# Patient Record
Sex: Male | Born: 1960 | Race: White | Hispanic: No | Marital: Married | Smoking: Never smoker
Health system: Southern US, Community
[De-identification: ages and names within clinical notes are randomized; demographics above are authoritative.]

## PROBLEM LIST (undated history)

## (undated) HISTORY — PX: KNEE SURGERY: SHX244

## (undated) HISTORY — PX: HIP SURGERY: SHX245

---

## 2003-10-06 ENCOUNTER — Emergency Department: Admit: 2003-10-06 | Payer: Self-pay | Source: Emergency Department | Admitting: Internal Medicine

## 2003-10-09 ENCOUNTER — Emergency Department: Admit: 2003-10-09 | Payer: Self-pay | Source: Emergency Department

## 2007-12-24 ENCOUNTER — Emergency Department
Admission: EM | Admit: 2007-12-24 | Disposition: A | Discharge status: Home / Self Care | Payer: Self-pay | Source: Emergency Department | Admitting: Emergency Medicine

## 2015-05-09 ENCOUNTER — Encounter (INDEPENDENT_AMBULATORY_CARE_PROVIDER_SITE_OTHER): Payer: Self-pay | Admitting: Family

## 2015-05-09 ENCOUNTER — Ambulatory Visit (INDEPENDENT_AMBULATORY_CARE_PROVIDER_SITE_OTHER): Payer: PRIVATE HEALTH INSURANCE | Admitting: Family

## 2015-05-09 VITALS — BP 130/79 | HR 55 | Temp 98.3°F | Resp 16 | Ht 70.0 in | Wt 198.0 lb

## 2015-05-09 DIAGNOSIS — R05 Cough: Secondary | ICD-10-CM

## 2015-05-09 DIAGNOSIS — R059 Cough, unspecified: Secondary | ICD-10-CM

## 2015-05-09 DIAGNOSIS — J069 Acute upper respiratory infection, unspecified: Secondary | ICD-10-CM

## 2015-05-09 LAB — POCT INFLUENZA A/B
POCT Rapid Influenza A AG: NEGATIVE
POCT Rapid Influenza B AG: NEGATIVE

## 2015-05-09 NOTE — Patient Instructions (Addendum)
OTC flonase, mucinex or sudafed during the day. Then cough suppressant OTC like robitussin or delsym. Cool mist humidifer. Start claritin or zyrtec once a day, please be patient, the medication can take up to 3-5 days to actually start to work then upwards of 30 days to build in the system.  Ten deep breaths every hour.  HYDRATE HYDRATE HYDRATE.        Adult Self-Care for Colds  Colds are caused by viruses. They can't be cured with antibiotics. However, you can relieve symptoms and support your body's efforts to heal itself. No matter which symptoms you have, be sure to drink plenty of fluids (water or clear soup); stop smoking and drinking alcohol; and get plenty of rest.   Understand a fever   Take your temperature several times a day. If your fever is100.4F(38.0C) for more than a day, call your doctor.   Relax, lie down. Go to bed if you want. Just get off your feet and rest. Also, drink plenty of fluids to avoid dehydration.   Take acetaminophen or a nonsteroidal anti-inflammatory agent (NSAID), such as ibuprofen.  Treat a troubled nose kindly   Breathe steam or heated humidified air to open blocked nasal passages. Stand in a hot shower or use a vaporizer. Be careful not to get burned by the steam.   Saline nasal sprays and decongestant tablets help open a stuffy nose. Antihistamines can also help, but they can cause side effects such as drowsiness and drying of the eyes, nose, and mouth.  Soothe a sore throat and cough   Gargle every2hours with1/4teaspoon of salt dissolved in1/2 cup of warm water. Suck on throat lozenges and cough drops to moisten your throat.   Cough medicines are available but it is unclear how effective they actually are.   Take acetaminophen or an NSAID, such as ibuprofen to ease throat pain  Ease digestive problems   Put fluid back into your body. Take frequent sips of clear liquids such as water or broth. Do not drink beverages with a lot of sugar in them, such as juices  and sodas. These can make diarrhea worse. Older children and adults can drink sports drinks.   As your appetite returns, you can resume your normal diet. Ask your doctor whether there are any foods you should avoid.    When to seek medical care  When you first notice symptoms, ask your health care provider if antiviral medications are appropriate.Antibiotics should not be taken for colds or flu. Also, call your doctor if you have any of the following symptoms or if you aren't feeling better after 7days:   Shortness of breath   Pain or pressure in the chest or abdomen   Worsening symptoms, especially after a period of improvement   Fever of100.4F (38.0C) or higher, or fever that doesn't go down with medication   Sudden dizziness or confusion   Severe or continued vomiting   Signs of dehydration, including extreme thirst, dark urine, infrequent urination, dry mouth   Spotted, red, or very sore throat   Date Last Reviewed: 08/17/2012   2000-2016 [employer] [address], [place] [address]. All rights reserved. This information is not intended as a substitute for professional medical care. Always follow your healthcare professional's instructions.

## 2015-05-09 NOTE — Progress Notes (Signed)
PRIMARY CARE WALK-IN    PROGRESS NOTE      Patient: Clinton Stephens   Date: 05/09/2015   MRN: [id number]     No past medical history on file.  Social History     Social History   . Marital Status: Married     Spouse Name: N/A   . Number of Children: N/A   . Years of Education: N/A     Occupational History   . Not on file.     Social History Main Topics   . Smoking status: Never Smoker    . Smokeless tobacco: Not on file   . Alcohol Use: No   . Drug Use: No   . Sexual Activity: Not on file     Other Topics Concern   . Not on file     Social History Narrative   . No narrative on file     No family history on file.    ASSESSMENT/PLAN     TRUSTIN CHAPA is a 55 y.o. male    Chief Complaint   Patient presents with   . Cough        1. Acute URI     2. Cough  POCT Influenza A/B       DDX: URI viral vs bacterial, seasonal allergies, acid reflux, PND, dry throat, PNA, bronchitis.       Results for orders placed or performed in visit on 05/09/15   POCT Influenza A/B   Result Value Ref Range    POCT QC Pass     POCT Rapid Influenza A AG Negative Negative    POCT Rapid Influenza B AG Negative Negative       Patient Instructions     OTC flonase, mucinex or sudafed during the day. Then cough suppressant OTC like robitussin or delsym. Cool mist humidifer. Start claritin or zyrtec once a day, please be patient, the medication can take up to 3-5 days to actually start to work then upwards of 30 days to build in the system.    Ten deep breaths every hour.  HYDRATE HYDRATE HYDRATE.          Adult Self-Care for Colds  Colds are caused by viruses. They can't be cured with antibiotics. However, you can relieve symptoms and support your body's efforts to heal itself. No matter which symptoms you have, be sure to drink plenty of fluids (water or clear soup); stop smoking and drinking alcohol; and get plenty of rest.   Understand a fever   Take your temperature several times a day. If your fever is100.47F(38.0C) for more than a day,  call your doctor.   Relax, lie down. Go to bed if you want. Just get off your feet and rest. Also, drink plenty of fluids to avoid dehydration.   Take acetaminophen or a nonsteroidal anti-inflammatory agent (NSAID), such as ibuprofen.  Treat a troubled nose kindly   Breathe steam or heated humidified air to open blocked nasal passages. Stand in a hot shower or use a vaporizer. Be careful not to get burned by the steam.   Saline nasal sprays and decongestant tablets help open a stuffy nose. Antihistamines can also help, but they can cause side effects such as drowsiness and drying of the eyes, nose, and mouth.  Soothe a sore throat and cough   Gargle every2hours with1/4teaspoon of salt dissolved in1/2 cup of warm water. Suck on throat lozenges and cough drops to moisten your throat.   Cough  medicines are available but it is unclear how effective they actually are.   Take acetaminophen or an NSAID, such as ibuprofen to ease throat pain  Ease digestive problems   Put fluid back into your body. Take frequent sips of clear liquids such as water or broth. Do not drink beverages with a lot of sugar in them, such as juices and sodas. These can make diarrhea worse. Older children and adults can drink sports drinks.   As your appetite returns, you can resume your normal diet. Ask your doctor whether there are any foods you should avoid.    When to seek medical care  When you first notice symptoms, ask your health care provider if antiviral medications are appropriate.Antibiotics should not be taken for colds or flu. Also, call your doctor if you have any of the following symptoms or if you aren't feeling better after 7days:   Shortness of breath   Pain or pressure in the chest or abdomen   Worsening symptoms, especially after a period of improvement   Fever of100.24F (38.0C) or higher, or fever that doesn't go down with medication   Sudden dizziness or confusion   Severe or continued  vomiting   Signs of dehydration, including extreme thirst, dark urine, infrequent urination, dry mouth   Spotted, red, or very sore throat   Date Last Reviewed: 08/17/2012   2000-2016 [employer] [address] [place], [place] [address]. All rights reserved. This information is not intended as a substitute for professional medical care. Always follow your healthcare professional's instructions.                Risk & Benefits of the new medication(s) were explained to the patient (and family) who verbalized understanding & agreed to the treatment plan. Patient (family) encouraged to contact me/clinical staff with any questions/concerns      MEDICATIONS     No current outpatient prescriptions on file.     No current facility-administered medications for this visit.       No Known Allergies    SUBJECTIVE     Chief Complaint   Patient presents with   . Cough        Cough  This is a new problem. Episode onset: 2 days ago. The problem has been gradually worsening. The problem occurs constantly. The cough is non-productive. Associated symptoms include a fever (100.5), myalgias, postnasal drip and rhinorrhea. Pertinent negatives include no chest pain, chills, ear pain, headaches, nasal congestion, rash, sore throat, shortness of breath or wheezing. Nothing aggravates the symptoms. Treatments tried: dayquil and nyquil. The treatment provided moderate relief. There is no history of environmental allergies.       ROS     Review of Systems   Constitutional: Positive for fever (100.5). Negative for chills, activity change, appetite change and fatigue.   HENT: Positive for congestion, postnasal drip, rhinorrhea and sneezing. Negative for ear discharge, ear pain, sinus pressure, sore throat and trouble swallowing.    Respiratory: Positive for cough. Negative for chest tightness, shortness of breath and wheezing.    Cardiovascular: Negative for chest pain and palpitations.   Gastrointestinal: Negative for nausea,  vomiting and abdominal pain.   Musculoskeletal: Positive for myalgias.   Skin: Negative for rash.   Allergic/Immunologic: Negative for environmental allergies.   Neurological: Negative for headaches.       The following portions of the patient's history were reviewed and updated as appropriate: Allergies, Current Medications, Past Family History, Past Medical  history, Past social history, Past surgical history, and Problem List.    PHYSICAL EXAM     Filed Vitals:    05/09/15 0918   BP: 130/79   Pulse: 55   Temp: 98.3 F (36.8 C)   TempSrc: Oral   Resp: 16   Height: 1.778 m (5\' 10" )   Weight: 89.812 kg (198 lb)   SpO2: 96%       Physical Exam   Constitutional: He is oriented to person, place, and time. He appears well-developed and well-nourished. No distress.   HENT:   Head: Normocephalic and atraumatic.   Right Ear: Tympanic membrane, external ear and ear canal normal.   Left Ear: Tympanic membrane, external ear and ear canal normal.   Nose: Nose normal. No mucosal edema or rhinorrhea. Right sinus exhibits no maxillary sinus tenderness and no frontal sinus tenderness. Left sinus exhibits no maxillary sinus tenderness and no frontal sinus tenderness.   Mouth/Throat: Oropharynx is clear and moist. No oropharyngeal exudate, posterior oropharyngeal edema or posterior oropharyngeal erythema.   Neck: Normal range of motion. Neck supple.   Cardiovascular: Normal rate, regular rhythm and normal heart sounds.    Pulmonary/Chest: Effort normal and breath sounds normal. He has no wheezes.   Lymphadenopathy:     He has no cervical adenopathy.   Neurological: He is alert and oriented to person, place, and time.   Skin: Skin is warm and dry. No rash noted.   Psychiatric: He has a normal mood and affect. His behavior is normal. Judgment and thought content normal.     Ortho Exam  Neurologic Exam     Mental Status   Oriented to person, place, and time.       PROCEDURE(S)     Procedures        Signed,  Earlyne Feeser Lescault  FNP-C  Supervising physician: Dr. Zorita Pang, MD was not present during this visit.

## 2019-03-05 ENCOUNTER — Ambulatory Visit (INDEPENDENT_AMBULATORY_CARE_PROVIDER_SITE_OTHER): Payer: Self-pay | Admitting: Cardiovascular Disease

## 2019-03-05 ENCOUNTER — Other Ambulatory Visit (INDEPENDENT_AMBULATORY_CARE_PROVIDER_SITE_OTHER): Payer: Self-pay

## 2019-03-28 ENCOUNTER — Ambulatory Visit (INDEPENDENT_AMBULATORY_CARE_PROVIDER_SITE_OTHER): Payer: Self-pay

## 2019-04-04 ENCOUNTER — Ambulatory Visit (INDEPENDENT_AMBULATORY_CARE_PROVIDER_SITE_OTHER): Payer: Self-pay | Admitting: Cardiovascular Disease

## 2019-04-09 ENCOUNTER — Ambulatory Visit (INDEPENDENT_AMBULATORY_CARE_PROVIDER_SITE_OTHER): Payer: Self-pay

## 2019-04-18 ENCOUNTER — Ambulatory Visit (INDEPENDENT_AMBULATORY_CARE_PROVIDER_SITE_OTHER): Payer: Self-pay | Admitting: Cardiovascular Disease

## 2019-04-18 ENCOUNTER — Other Ambulatory Visit (INDEPENDENT_AMBULATORY_CARE_PROVIDER_SITE_OTHER): Payer: Self-pay

## 2019-05-04 ENCOUNTER — Other Ambulatory Visit: Payer: Self-pay | Admitting: Cardiovascular Disease

## 2019-06-06 ENCOUNTER — Ambulatory Visit (INDEPENDENT_AMBULATORY_CARE_PROVIDER_SITE_OTHER): Payer: Self-pay | Admitting: Cardiovascular Disease

## 2020-06-04 NOTE — H&P (Signed)
[facility] OFFICE  [address] [place], [place] [address]           Clinton Stephens    Date of Visit:  03/05/2019  Date of Birth: [birth year]  Age: 60 yrs.   Medical Record Number: [id number]  __  CURRENT DIAGNOSES     1. Shortness of breath, R06.02   2. Chest pain, unspecified, R07.9  3. Encounter for screening for other viral diseases, Z11.59  __  ALLERGIES     No Known Drug Allergies  __  MEDICATIONS     1. carbidopa 25 mg-levodopa 100 mg tablet, 1 po  tid  2. coenzyme Q10 200 mg capsule, 600mg  po qd  3. entacapone 200 mg tablet, 1 po bid  4. multivitamin tablet, 1 po qd  5. rasagiline mesylate (bulk) 100 % powder, 1mg  po qd  6. Super B-50 Complex capsule, Take as Directed  __   CHIEF COMPLAINT/REASON FOR VISIT  [place] discomfort, shortness of breath.  __  HISTORY OF PRESENT ILLNESS     Mr. Ledwell presents today for initial cardiovascular assessment. 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 He has no symptoms are  rest or at lower levels of exertion. And his symptoms at higher exercise levels are also only intermittent. His ECG and cardiovascular physical exam are both unremarkable in the office today.        __   PAST HISTORY     Past Medical Illnesses: Parkinson's Disease;   Past Cardiac Illnesses: No previous history of cardiac disease.; Infectious Diseases : history of Chicken Pox; Surgical Procedures: No previous surgical procedures.; Trauma History : No previous history of significant trauma.; Cardiology Procedures-Invasive: No previous interventional or invasive cardiology procedures.;  Cardiology Procedures-Noninvasive: No previous non-invasive cardiovascular testing.  ___  FAMILY  HISTORY  Father -- Parkinsonism, Senile dementia  Mother --  Cancer (Jaw Cancer), Macular degeneration  Sister -- Hypertension    __  CARDIAC RISK FACTORS      Tobacco Abuse: has never used  tobacco; Family History of Heart Disease: no family history of cardiovascular  disease; Hyperlipidemia: negative; Hypertension : negative;  Diabetes Mellitus: negative; Prior History of Heart Disease : negative; Obesity: negative; Sedentary Life Style :negative; JWJ:[id number]; Menopausal:not applicable   __  SOCIAL HISTORY    Alcohol Use : 1-2 drinks daily; Smoking: Does not smoke; Never smoker ([id number]); Diet : Regular diet; Exercise: cycling 2 hrs, 2-3x/week;   __  REVIEW OF SYSTEMS     General: Recent decreased exercise tolerance, Recent weight gain; Integumentary: Denies any change  in hair or nails, rashes, or skin lesions.; Eyes: wears eye glasses/contact lenses; Ears, Nose, Throat,  Mouth: Denies any hearing loss, epistaxis, hoarseness or difficulty speaking.;Respiratory: Denies dyspnea,  cough, wheezing or hemoptysis.; Cardiovascular: Positive for chest discomfort; Abdominal  : Denies ulcer disease, hematochezia or melena.;Musculoskeletal:Denies any venous insufficiency, arthritic symptoms or back problems.;  Neurological : Denies any recurrent strokes, TIA, or seizure disorder.; Psychiatric: Denies any depression,  substance abuse or change in cognitive functions.; Endocrine: Denies any weight change, heat/cold intolerance, polydipsia, or polyuria;  Hematologic/Immunologic: Denies any food allergies, seasonal allergies, bleeding disorders.  __  PHYSICAL EXAMINATION     Vital Signs:  Blood Pressure:  148/100 Supine, Right arm, regular cuff  130/90 Sitting, Left arm, regular cuff  140/100 Sitting, Right  arm, regular cuff  Weight: 194.20 lbs.  Height:  71"  BMI: 27.08   Pulse: 58/min.        Constitutional: Cooperative, alert and oriented,well developed, well nourished, in no acute distress. Skin:  Warm and dry to touch, no apparent skin lesions, or masses noted. Head: Normocephalic, normal hair pattern, no masses or tenderness  Eyes: EOMS Intact, PERRL, conjunctivae and lids normal. Funduscopic exam and  visual fields not performed. ENT : Ears, Nose and throat reveal no gross abnormalities. No pallor or cyanosis. Dentition good. Neck: No palpable masses or adenopathy, no thyromegaly,  no JVD, carotid pulses are full and equal bilaterally without bruits. Chest: Normal symmetry, no tenderness to palpation, normal respiratory excursion,  no intercostal retraction, no use of accessory muscles, normal diaphragmatic excursion, clear to auscultation and percussion. Cardiac: Regular rhythm,  S1 normal, S2 normal, No S3 or S4, Apical impulse not displaced, no murmurs, gallops or rubs detected. Abdomen: Abdomen soft, bowel sounds normoactive,  no masses, no hepatosplenomegaly, non-tender, no bruits Peripheral Pulses: The femoral, popliteal, dorsalis pedis, and posterior tibial pulses are full  and equal bilaterally with no bruits auscultated. Extremities/Back: No deformities, clubbing, cyanosis, erythema or edema observed. There are no spinal  abnormalities noted. Normal muscle strength and tone. Neurological: No gross motor or sensory deficits noted, affect appropriate, oriented to time, person  and place.   __    IMPRESSIONS:    1. Chest discomfort/shortness of breath (see HPI above for details).  2. Parkinsons.  3. Never smoker.    RECOMMENDATIONS:    1. Exercise treadmill stress test.  2. Echocardiogram.   3. Lipid panel.  4. Home BP checks: bring journal to follow-up.  5. Follow-up after above tests complete (and maintain lower levels of exercise [in asymptomtatic zone] in the interim).      Gwyneth Sprout. Idolina Primer, MD      cc:  ERIC S. HAVENS DO    ____________________________  TODAYS ORDERS  2D, color flow, doppler echocardiogram 1 week  Diet_mgmt_edu,_guidance_and_counseling TODAY  Lipid Panel 1 week  COVID-19-SARS-CoV-2 Antigen/RT-PCR Test 1 week   12 Lead ECG Today  Return Visit 15 MIN 3 weeks  Exercise Treadmill Bruce 1 week

## 2020-06-04 NOTE — Progress Notes (Signed)
[facility] OFFICE  [address] [place], [place] [address]           Clinton Stephens    Date of Visit:  04/18/2019  Date of Birth: 05-28-1960  Age: 60 yrs.   Medical Record Number: [id number]  __  CURRENT DIAGNOSES     1. Hyperlipidemia, mixed, E78.2   2. Hypertension (essential or benign or malignant), I10  3. Palpitations, R00.2  4. Shortness of breath, R06.02  5. Chest pain, unspecified, R07.9  6. Abnormal test-abnormal exercise stress test, R94.30  __   ALLERGIES    No Known Drug Allergies  __  MEDICATIONS      1. carbidopa 25 mg-levodopa 100 mg tablet, 1 po tid  2. coenzyme Q10 200 mg capsule, 600mg  po qd  3. entacapone 200 mg tablet, 1 po bid  4. multivitamin tablet, 1 po qd  5. rasagiline mesylate (bulk) 100 % powder, 1mg  po qd   6. Super B-50 Complex capsule, Take as Directed  __  CHIEF COMPLAINT/REASON FOR VISIT  Followup of Chest discomfort, shortness of breath.   __  HISTORY OF PRESENT ILLNESS    Clinton Stephens returns today for cardiovascular follow-up. He continues to note intermittent exertional chest discomfort.  He continues to exercise. He had no ECG evidence of ischemia on recent treadmill stress testing. His Echocardigoram was normal. He does also note palpitations and did have intermittent PVCs during stress testing. He had a hypertensive response to stress  testing but reports home BPs in the "130s" (he did not bring his journal today for review).      __  PAST HISTORY      Past Medical Illnesses: Parkinson's Disease;  Past Cardiac Illnesses : No previous history of cardiac disease.; Infectious Diseases: history of Chicken Pox; Surgical Procedures : No previous surgical procedures.; Trauma History: No previous history of significant trauma.; Cardiology  Procedures-Invasive: No previous interventional or invasive cardiology procedures.; Cardiology Procedures-Noninvasive : No previous non-invasive cardiovascular testing.  ___  FAMILY HISTORY  Father --  Parkinsonism, Senile  dementia  Mother -- Cancer (Jaw Cancer), Macular degeneration  Sister  -- Hypertension    __  CARDIAC RISK FACTORS     Tobacco Abuse: has never used tobacco;  Family History of Heart Disease: no family history of cardiovascular disease; Hyperlipidemia: negative;  Hypertension: negative;  Diabetes Mellitus: negative;  Prior History of Heart Disease: negative; Obesity: negative;  Sedentary Life Style:negative; JWJ:XBJYNWGN; Menopausal :not applicable  __  SOCIAL HISTORY     Alcohol Use: 1-2 drinks daily; Smoking: Does not smoke; Never smoker ([id number]);  Diet: Regular diet; Exercise: cycling 2 hrs, 2-3x/week;   __   REVIEW OF SYSTEMS    General: Recent decreased exercise tolerance, Recent weight gain;  Integumentary: Denies any change in hair or nails, rashes, or skin lesions.; Eyes: wears eye glasses/contact  lenses; Ears, Nose, Throat, Mouth: Denies any hearing loss, epistaxis, hoarseness or difficulty speaking.; Respiratory: Denies dyspnea, cough, wheezing or hemoptysis.; Cardiovascular: Positive for chest discomfort;  Abdominal : Denies ulcer disease, hematochezia or melena.;Musculoskeletal:Denies any venous insufficiency,  arthritic symptoms or back problems.; Neurological : Denies any recurrent strokes, TIA, or seizure disorder.;  Psychiatric: Denies any depression, substance abuse or change in cognitive functions.; Endocrine: Denies  any weight change, heat/cold intolerance, polydipsia, or polyuria; Hematologic/Immunologic: Denies any food allergies, seasonal allergies, bleeding disorders.   __  PHYSICAL EXAMINATION    Vital Signs:  Blood Pressure:   134/80 Sitting, Right arm, regular cuff  128/80  Sitting, Left arm, regular cuff    Weight: 192.00 lbs.   Height: 71.00"  BMI: 26.78   Pulse:  62/min. apical       Constitutional: Cooperative, alert and oriented,well developed, well nourished, in no acute distress.  Skin: Warm and dry to touch, no apparent skin lesions, or masses noted. Head: Normocephalic, normal   hair pattern, no masses or tenderness Eyes: EOMS Intact, PERRL, conjunctivae and lids normal. Funduscopic exam and visual fields not performed.  ENT: Ears, Nose and throat reveal no gross abnormalities. No pallor or cyanosis. Dentition good. Neck : No palpable masses or adenopathy, no thyromegaly, no JVD, carotid pulses are full and equal bilaterally without bruits. Chest: Normal symmetry, no  tenderness to palpation, normal respiratory excursion, no intercostal retraction, no use of accessory muscles, normal diaphragmatic excursion, clear to auscultation and percussion. Cardiac : Regular rhythm, S1 normal, S2 normal, No S3 or S4, Apical impulse not displaced, no murmurs, gallops or rubs detected. Abdomen: Abdomen soft, bowel  sounds normoactive, no masses, no hepatosplenomegaly, non-tender, no bruits Peripheral Pulses: The femoral, popliteal, dorsalis pedis, and posterior  tibial pulses are full and equal bilaterally with no bruits auscultated. Extremities/Back: No deformities, clubbing, cyanosis, erythema or edema observed.  There are no spinal abnormalities noted. Normal muscle strength and tone. Neurological: No gross motor or sensory deficits noted, affect appropriate,  oriented to time, person and place.   __    IMPRESSIONS:    1. Intermittent exertional chest discomfort and shortness of breath.  2. Exercise Stress Test 04-2019: no ECG evidence of ischemia, hypertensive response to exercise, recurrent  symptoms, intermittent PVCs.  3. Echo 04-2019: normal LV systolic function, no significant valvular disease.  4. Lipids 03-2019: LDL 109, HDL 70, TG 50.  5. Home BPs: "130s" (did not bring journal to this appointment).  6. Parkinsons.   7. Never smoker.  8. Palpitations.    RECOMMENDATIONS:    1. CT Coronary Angiogram: for definitive anatomic assessment in setting of recurrent exertional chest discomfort.  2. Defer decision-making re: Statin dependent on above results.   3. Holter monitor in setting of PVCs on  stress testing and intermittent palpitations.  4. Follow-up after above tests complete.  5. Continue home BP checks: and will review again at next visit.      Gwyneth Sprout. Idolina Primer, MD       cc: ERIC S. HAVENS DO    ____________________________  TODAYS ORDERS  Holter Monitor (48 Hours) Today  Telehealth Visit 6 weeks

## 2020-06-04 NOTE — Progress Notes (Signed)
[facility] OFFICE  [address] [place], [place] [address]     TELEMEDICINE VISIT       Clinton Stephens, Clinton Stephens    Date of Visit:  06/06/2019  Date of Birth: February 05, 1961  Age: 60 yrs.   Medical Record Number: [id number]  __  CURRENT DIAGNOSES     1. Hyperlipidemia, mixed, E78.2   2. Hypertension (essential or benign or malignant), I10  3. Palpitations, R00.2  4. Shortness of breath, R06.02  5. Chest pain, unspecified, R07.9  6. Abnormal test-abnormal exercise stress test, R94.30  __   ALLERGIES    No Known Drug Allergies  __  MEDICATIONS      1. aspirin 81 mg tablet,delayed release, 1 po qd  2. carbidopa 25 mg-levodopa 100 mg tablet, 1 po tid  3. coenzyme Q10 200 mg capsule, 600mg  po qd  4. entacapone 200 mg tablet, 1 po bid  5. multivitamin tablet, 1 po qd   6. rasagiline mesylate (bulk) 100 % powder, 1mg  po qd  7. Super B-50 Complex capsule, Take as Directed  __  CHIEF COMPLAINT/REASON FOR VISIT   Followup of Cardiovascular risk assessment.  __  HISTORY OF PRESENT ILLNESS    The visit today was conducted via telemedicine (Doximity) due  to COVID-19 precautions. The patient was in their home and was informed and gave verbal consent to proceed.    Clinton Stephens returns today for cardiovascular follow-up. His symptoms have all resolved. His multiple cardiovascular tests (see below for  details) were all unremarkable. He continues to exercise regularly (and has actually increased). He also just started incorporating new dietary changes. His home BP checks were borderline: with values in the 130s-140s (and he would like to defer consideration  of therapy for now pending BP rechecks in a few months after incorporating additional lifestyle modifications).      __  PAST HISTORY      Past Medical Illnesses: Parkinson's Disease;  Past Cardiac Illnesses : No previous history of cardiac disease.; Infectious Diseases: history of Chicken Pox; Surgical Procedures : No previous surgical procedures.; Trauma History: No previous  history of significant trauma.; Cardiology  Procedures-Invasive: No previous interventional or invasive cardiology procedures.; Cardiology Procedures-Noninvasive : CTA 05/04/19  ___  FAMILY HISTORY  Father --  Parkinsonism, Senile dementia  Mother -- Cancer (Jaw Cancer), Macular degeneration  Sister  -- Hypertension    __  CARDIAC RISK FACTORS     Tobacco Abuse: has never used tobacco;  Family History of Heart Disease: no family history of cardiovascular disease; Hyperlipidemia: negative;  Hypertension: negative;  Diabetes Mellitus: negative;  Prior History of Heart Disease: negative; Obesity: negative;  Sedentary Life Style:negative; MWU:[id number]; Menopausal :not applicable  __  SOCIAL HISTORY     Alcohol Use: 1-2 drinks daily; Smoking: Does not smoke; Never smoker ([id number]);  Diet: Regular diet; Exercise: cycling 2 hrs, 2-3x/week;   __   REVIEW OF SYSTEMS    General: Recent decreased exercise tolerance, Recent weight gain;  Integumentary: Denies any change in hair or nails, rashes, or skin lesions.; Eyes: wears eye glasses/contact  lenses; Ears, Nose, Throat, Mouth: Denies any hearing loss, epistaxis, hoarseness or difficulty speaking.; Respiratory: Denies dyspnea, cough, wheezing or hemoptysis.; Cardiovascular: Positive for chest discomfort;  Abdominal : Denies ulcer disease, hematochezia or melena.;Musculoskeletal:Denies any venous insufficiency,  arthritic symptoms or back problems.; Neurological : Denies any recurrent strokes, TIA, or seizure disorder.;  Psychiatric: Denies any depression, substance abuse or change in cognitive functions.; Endocrine: Denies  any  weight change, heat/cold intolerance, polydipsia, or polyuria; Hematologic/Immunologic: Denies any food allergies, seasonal allergies, bleeding disorders.   __  PHYSICAL EXAMINATION    Vital Signs:  Blood Pressure:       Weight: 194.00 lbs.  Height: 71.00"  BMI:  27.05       Constitutional: Cooperative, alert and oriented,well developed, well  nourished, in no acute distress.   Neck: no JVD Chest: Normal symmetry, normal respiratory  excursion, no intercostal retraction, no use of accessory muscles, normal diaphragmatic excursion Extremities/Back: no edema  Neurological: affect appropriate, oriented to time, person and place.   __    IMPRESSIONS:    1. Intermittent exertional chest discomfort and shortness of breath: now resolved.  2.  Exercise Stress Test 04-2019: Bruce protocol, 14 minutes exercise, no ECG evidence of ischemia, hypertensive response to exercise, recurrent symptoms, intermittent PVCs.  3. CTA coronaries 04-2019: calcium score = 0, minimal LAD and RCA plaque.   4. Echo 04-2019: normal LV systolic function, no significant valvular disease.  5. Lipids 03-2019: LDL 109, HDL 70, TG 50.  6. Home BPs: 130s-140s.  7. Parkinsons.  8. Never smoker.  9. Palpitations: resolved. Holter 04-2019: sinus rhythm,  average HR 59, no arrhythmia.    RECOMMENDATIONS:    1. Continue regule exercise (which he has begun to increase).  2. Continue recent dietary changes.  3. Reasess home BPs in 3 months and call with results: to giude consideration  of antihypertensive therapy or not.  4. Continue regular primary care follow-up.        Gwyneth Sprout. Idolina Primer, MD      cc: ERIC S. HAVENS DO

## 2020-07-11 ENCOUNTER — Encounter (INDEPENDENT_AMBULATORY_CARE_PROVIDER_SITE_OTHER): Payer: Self-pay

## 2020-07-11 ENCOUNTER — Ambulatory Visit (INDEPENDENT_AMBULATORY_CARE_PROVIDER_SITE_OTHER): Payer: PRIVATE HEALTH INSURANCE | Admitting: Internal Medicine

## 2020-07-11 VITALS — BP 128/76 | HR 75 | Temp 98.2°F | Resp 16 | Ht 71.0 in | Wt 196.0 lb

## 2020-07-11 DIAGNOSIS — U071 COVID-19: Secondary | ICD-10-CM

## 2020-07-11 DIAGNOSIS — R059 Cough, unspecified: Secondary | ICD-10-CM

## 2020-07-11 LAB — IHS AMB POCT SOFIA (TM) COVID-19 & FLU A/B
Sofia Influenza A Ag POCT: NEGATIVE
Sofia Influenza B Ag POCT: NEGATIVE
Sofia SARS COV2 Antigen POCT: POSITIVE — AB

## 2020-07-11 MED ORDER — NIRMATRELVIR & RITONAVIR 20 X 150 MG & 10 X 100MG PO TBPK
3.0000 | ORAL_TABLET | Freq: Two times a day (BID) | ORAL | 0 refills | Status: AC
Start: 2020-07-11 — End: 2020-07-16

## 2020-07-11 NOTE — Patient Instructions (Addendum)
What to do if you have confirmed or suspected coronavirus disease (COVID-19)?    If you are sick with COVID-19 or think you might have the virus that causes COVID-19, follow the steps below to help prevent the disease from spreading to people in your home and your community.    The most common signs of COVID-19 are: fever, cough, and difficulty breathing. If you think you have been exposed to COVID-19 and are having these symptoms, consider calling your doctor or other healthcare provider for medical advice. Your doctor will decide if you need to be tested or seen in person. Keep in mind that there is no treatment for COVID-19. People who are mildly ill may not need to be tested and should isolate (keep away from other people) and care for themselves at home.    Take these steps to monitor your health while you stay home and practice social distancing:    Stay home, except to get medical care, get rest and drink plenty of fluids.  Even if you are only mildly ill with COVID-19, stay home while you are sick.   Get rest and drink plenty of fluids.  Do not go to work, school, or public areas.  Avoid using public transportation such as buses, trains, taxis or ride-shares.  If someone in your household has tested positive for COVID-19, the entire household should stay home    Separate yourself from other people and animals in your home.  As much as possible, stay in a specific room away from others in your home. Use a separate bathroom, if available.  Avoid sharing personal household items such as dishes, drinking glasses, cups, utensils, towels, or bedding with other people or pets in your home. After using these items, they should be washed thoroughly with soap and water.  Restrict contact with pets and other animals while sick. Avoid petting, snuggling, being kissed or licked, and sharing food with your pet. When possible, have another member of your household care for your animals while you are sick. If you must care  for your pet or be around animals while you are sick, wash your hands before and after you interact with pets and wear a facemask.    Call ahead before visiting a doctor and monitor your symptoms.  Seek prompt medical attention if your illness is getting worse (e.g., difficulty breathing)  If you have a medical appointment or are seeking care, call the doctor's office and tell them you have, or may have COVID-19. Put on a facemask before you enter the building to help keep other people in the office or waiting room from sick.  If you need emergency medical care, call 911 and notify the dispatch personnel that you have, or may have COVID-19.   Emergency warning signs for COVID-19 may include: difficult breathing or shortness of breath, pain or pressure in the chest that doesn't go away, new confusion or inability to arouse, or bluish lips or face.    Wear a facemask when around other people or pets.  If you are sick, you should wear a facemask when you are around other people (e.g., sharing a room or vehicle) or pets and before you enter a doctor's office.  If the person who is sick is unable to wear a facemask (for example, because it causes trouble breathing) then other household members should wear a facemask if they enter a room with the person who is sick.    Cover your coughs and sneezes and   clean your hands often. Avoid sharing personal items.  Cover your mouth and nose with a tissue when you cough or sneeze and throw used tissues in a lined trashcan. Wash hands right after.  Wash hands often with soap and water for at least 20 seconds, especially after blowing your nose, coughing, sneezing; going to the bathroom; and before eating or preparing food.  If soap and water are not readily available, an alcohol-based hand sanitizer with at least 60% alcohol may be used. Cover all surfaces of your hands and rub them together until they feel dry.  Avoid touching your eyes, nose, and mouth with unwashed  hands.    Clean all frequently touched surfaces daily.  Clean and disinfect all surfaces that get touched often, such as counters, tabletops, doorknobs, bathroom fixtures, toilets, phones, keyboards, tablets, and bedside tables.  Disinfect areas with bodily fluids such as blood, stool, or body fluids on them.  Use a household cleaning spray or wipe, according to the label instructions.  For more information on household cleaning and disinfection: PokerWatcher.com.ee.html     Discontinuation home isolation.  If you are sick and have confirmed or suspected COVID-19 and have been directed to isolate at home, you can stop home isolation under the following conditions:  You have had no fever for at least 72 hours (that is three full days of no fever without the use of medicine that reduces fevers) AND other symptoms have improved (for example, when your cough or shortness of breath have improved) AND at least 7 days have passed since your symptoms first appeared.    For more information:  Visit [place] Department of Health's [facility]) COVID-19 website at: PrimeLetters.ca  Visit CDC's COVID-19 website "What To Do if You Are Sick" at: https://www.carlson.net/.html  Call [facility] COVID-19 hotline at 877-ASK-VDH3  (materials dated May 17, 2018)Take all prescriptions as directed.  Tylenol and or ibuprofen as directed on the bottles.  Nasal saline several times a day.  Nasal steroid.  Mucinex DM.  Push fluids with electrolytes.  To the Emergency Room or call 911 for any chest pain, difficulty breathing, heart racing, any concern.  Follow up with your doctor in 2-3 days.

## 2020-07-11 NOTE — Progress Notes (Signed)
[facility]  PROGRESS NOTE     Patient: Clinton Stephens   Date: 07/11/2020   MRN: [id number]       Clinton Stephens is a 60 y.o. male      HISTORY     History obtained from: Patient    Chief Complaint   Patient presents with   . Nasal Congestion     Nasal congestion, head pressure, and body aches began on Wednesday. Pt is covid vaccinated with booster.         60 yo M with Hx of Parkinson's, on meds.  C/O acute onset on 5-11 of NC, H/A, myalgia.  No Fever, N/V/D, SOB or CP.  Feels like he can't get a full breath in.  Hx of childhood asthma not on meds since then.  + COVID boosted and Flu vaccinated.            Review of Systems   All other systems reviewed and are negative.  As above.    History:    Pertinent Past Medical, Surgical, Family and Social History were reviewed.        Current Outpatient Medications:   .  carbidopa-levodopa (SINEMET) 25-100 MG per tablet, Take by mouth, Disp: , Rfl:   .  amantadine (SYMMETREL) 100 MG tablet, , Disp: , Rfl:   .  nirmatrelvir & ritonavir (PAXLOVID) 150 mg & 100 mg therapy pack (emergency use authorization), Take 3 tablets by mouth 2 (two) times daily for 5 days The dosage for PAXLOVID is 300 mg nirmatrelvir (two 150 mg tablets) with 100 mg ritonavir (one 100 mg tablet) with all three tablets taken together., Disp: 30 tablet, Rfl: 0  .  Rasagiline Mesylate 1 MG Tab, , Disp: , Rfl:     No Known Allergies    Medications and Allergies reviewed.    PHYSICAL EXAM     Vitals:    07/11/20 0828   BP: 128/76   Pulse: 75   Resp: 16   Temp: 98.2 F (36.8 C)   TempSrc: Tympanic   SpO2: 100%   Weight: 88.9 kg (196 lb)   Height: 1.803 m (5\' 11" )       Physical Exam  HENT:      Nose: Congestion present. No rhinorrhea.      Mouth/Throat:      Mouth: Mucous membranes are moist.      Pharynx: Oropharynx is clear. No oropharyngeal exudate or posterior oropharyngeal erythema.   Pulmonary:      Effort: Pulmonary effort is normal. No respiratory distress.      Breath sounds: Normal breath  sounds. No stridor. No wheezing, rhonchi or rales.   Chest:      Chest wall: No tenderness.   Vitals and nursing note reviewed.         UCC COURSE     LABS  The following POCT tests were ordered, reviewed and discussed with the patient/family.     Results     Procedure Component Value Units Date/Time    Sofia(TM) SARS COVID19 & Flu A/B POCT [[id number]]  (Abnormal) Collected: 07/11/20 0855     Updated: 07/11/20 0855     Sofia SARS COV2 Antigen POCT Positive     Sofia Influenza A Ag POCT Negative     Sofia Influenza B Ag POCT Negative          There were no x-rays reviewed with this patient during the visit.    No current facility-administered medications for  this visit.       PROCEDURES     Procedures    MEDICAL DECISION MAKING     History, physical, labs/studies most consistent with COVID 19 infection as the diagnosis.    Chart Review:  Prior PCP, Specialist and/or ED notes reviewed today: No  Prior labs/images/studies reviewed today: No    Differential Diagnosis: COVID 19; Flu; Viral syndrome      ASSESSMENT     Encounter Diagnoses   Name Primary?   . Cough Yes   . COVID-19 virus infection             PLAN      PLAN: COVID precautions; Self quarantine; Paxlovid; Tylenol / ibuprofen; Push fluids; Nasal saline; Nasal steroid; Mucinex DM; F/U with PCP in 2-3 days.     Body mass index is 27.34 kg/m.      Orders Placed This Encounter   Procedures   . Sofia(TM) SARS COVID19 & Flu A/B POCT     Requested Prescriptions     Signed Prescriptions Disp Refills   . nirmatrelvir & ritonavir (PAXLOVID) 150 mg & 100 mg therapy pack (emergency use authorization) 30 tablet 0     Sig: Take 3 tablets by mouth 2 (two) times daily for 5 days The dosage for PAXLOVID is 300 mg nirmatrelvir (two 150 mg tablets) with 100 mg ritonavir (one 100 mg tablet) with all three tablets taken together.       Discussed results and diagnosis with patient/family.  Reviewed warning signs for worsening condition, as well as, indications for follow-up with  primary care physician and return to urgent care clinic.   Patient/family expressed understanding of instructions.     An After Visit Summary was provided to the patient.

## 2020-08-18 ENCOUNTER — Ambulatory Visit (INDEPENDENT_AMBULATORY_CARE_PROVIDER_SITE_OTHER): Payer: PRIVATE HEALTH INSURANCE | Admitting: Internal Medicine

## 2020-08-18 ENCOUNTER — Encounter (INDEPENDENT_AMBULATORY_CARE_PROVIDER_SITE_OTHER): Payer: Self-pay | Admitting: Internal Medicine

## 2020-08-18 VITALS — BP 120/72 | HR 77 | Temp 98.2°F | Resp 16 | Ht 71.0 in | Wt 191.6 lb

## 2020-08-18 DIAGNOSIS — R059 Cough, unspecified: Secondary | ICD-10-CM

## 2020-08-18 DIAGNOSIS — R0981 Nasal congestion: Secondary | ICD-10-CM

## 2020-08-18 DIAGNOSIS — R519 Headache, unspecified: Secondary | ICD-10-CM

## 2020-08-18 LAB — POCT INFLUENZA A/B
POCT Rapid Influenza A AG: NEGATIVE
POCT Rapid Influenza B AG: NEGATIVE

## 2020-08-18 NOTE — Patient Instructions (Signed)
Tylenol and or ibuprofen as directed on the bottles.  Nasal saline several times a day.  Nasal steroid.  Mucinex DM.  Push fluids with electrolytes.  To the Emergency Room or call 911 for any chest pain, difficulty breathing, heart racing, any concern.  Follow up with your doctor in 2-3 days.        Viral Syndrome (Adult)  A viral illness may cause many symptoms such as fever. Other symptoms depend on the part of the body that the virus affects. If it settles in your nose, throat, and lungs, it may cause cough, sore throat, congestion, runny nose, headache, earache and other ear symptoms, or shortness of breath. If it settles in your stomach and intestinal tract, it may cause nausea, vomiting, cramping, and diarrhea. Sometimes it causes generalized symptoms like aching all over, feeling tired, loss of energy, or loss of appetite.   A viral illness often lasts anywhere from a few days to a few weeks. But sometimes it lasts longer. In some cases, a more serious infection can look like a viral syndrome in the first few days of the illness. You may need another exam and additional tests to know the difference. Watch for the warning signs listed below for when to get medical advice.   Home care  Follow these guidelines for taking care of yourself at home:  If symptoms are severe, rest at home for the first 2 to 3 days.  Stay away from cigarette smoke - both your smoke and the smoke from others.  You may use over-the-counter acetaminophen or ibuprofen for fever, muscle aching, and headache, unless another medicine was prescribed for this. Antibiotics aren't used to treat viral infections. If you have chronic liver or kidney disease or ever had a stomach ulcer or gastrointestinal bleeding, talk with your healthcare provider before using these medicines. No one who is younger than 18 and ill with a fever should take aspirin. It may cause severe disease or death.  Your appetite may be poor, so a light diet is fine. Prevent  dehydration by drinking 8 to 12, 8-ounce glasses of fluids each day. This may include water; orange juice; lemonade; apple, grape, and cranberry juice; clear fruit drinks; electrolyte replacement and sports drinks; and decaffeinated teas and coffee. If you've been diagnosed with a kidney disease, ask your healthcare provider how much and what types of fluids you should drink to prevent dehydration. If you have kidney disease, drinking too much fluid can cause it build up in your body and be dangerous to your health.  Over-the-counter remedies won't shorten the length of the illness. But they may be helpful for symptoms such as cough, sore throat, nasal and sinus congestion, or diarrhea. Don't use decongestants if you have high blood pressure.  Follow-up care  Follow up with your healthcare provider if you don't get better over the next week.   Call 911  Call 911 if any of these occur:   Convulsion  Feeling weak, dizzy, or like you are going to faint  Chest pain, or more than mild shortness of breath  When to get medical advice  Call your healthcare provider right away if any of these occur:  Cough with lots of colored sputum (mucus) or blood in your sputum  Chest pain, shortness of breath, wheezing, or trouble breathing  Severe headache; face, neck, or ear pain  Severe, constant pain in the lower right side of your belly (abdominal)  Continued vomiting (can’t keep liquids down)    Frequent diarrhea (more than 5 times a day), or blood (red or black color) or mucus in diarrhea  Feeling weak, dizzy, or like you are going to faint  Extreme thirst  Fever of 100.4°F (38°C) or higher, or as directed by your provider  StayWell last reviewed this educational content on 03/01/2020    © 2000-2022 [employer]. All rights reserved. This information is not intended as a substitute for professional medical care. Always follow your healthcare professional's instructions.

## 2020-08-18 NOTE — Progress Notes (Signed)
[facility]  PROGRESS NOTE     Patient: Clinton Stephens   Date: 08/18/2020   MRN: [id number]       Clinton Stephens is a 60 y.o. male      HISTORY     History obtained from: Patient    Chief Complaint   Patient presents with    URI     Symptoms started on 6/18 having congestion, runny nose. coughing, headaches, chills, body aches etc. Took mucinex this morning.         60 yo M with Hx of Parkinson's, on meds.  C/O acute onset on 6-18 of myalgia, H/A, rhinorrhea, NC and H/A.  No F/C, N/V/D, SOB or CP.  Mucinex DM, Nasal saline and Nasal steroid with some relief.  COVID + 4 weeks ago.       Review of Systems   All other systems reviewed and are negative.  As above.    History:    Pertinent Past Medical, Surgical, Family and Social History were reviewed.        Current Outpatient Medications:     amantadine (SYMMETREL) 100 MG tablet, , Disp: , Rfl:     carbidopa-levodopa (SINEMET) 25-100 MG per tablet, Take by mouth, Disp: , Rfl:     Rasagiline Mesylate 1 MG Tab, , Disp: , Rfl:     No Known Allergies    Medications and Allergies reviewed.    PHYSICAL EXAM     Vitals:    08/18/20 0811   BP: 120/72   Pulse: 77   Resp: 16   Temp: 98.2 F (36.8 C)   TempSrc: Tympanic   SpO2: 99%   Weight: 86.9 kg (191 lb 9.6 oz)   Height: 1.803 m (5\' 11" )       Physical Exam  Constitutional:       General: He is not in acute distress.     Appearance: Normal appearance. He is not ill-appearing, toxic-appearing or diaphoretic.   Pulmonary:      Effort: Pulmonary effort is normal. No respiratory distress.      Breath sounds: Normal breath sounds. No stridor. No wheezing, rhonchi or rales.   Neurological:      Mental Status: He is alert.   Vitals and nursing note reviewed.   Physical Exam  Constitutional:       General: Not in acute distress.     Appearance: Normal appearance. Not ill-appearing or toxic-appearing.   HENT:      Head: Normocephalic and atraumatic.   Eyes:      Conjunctiva/sclera: Conjunctivae normal.   Neck:       Musculoskeletal: Normal range of motion.   Respiratory:      Normal effort. Able to speak in full sentences.  Neurological:      Mental Status: Alert and oriented.  Psychiatric:         Mood and Affect: Mood normal.         Behavior: Behavior normal.       UCC COURSE     There were no labs reviewed with this patient during the visit.    There were no x-rays reviewed with this patient during the visit.    No current facility-administered medications for this visit.       PROCEDURES     Procedures    MEDICAL DECISION MAKING     History, physical, labs/studies most consistent with acute viral syndrome   as the diagnosis.    Chart Review:  Prior PCP, Specialist and/or ED notes reviewed today: No  Prior labs/images/studies reviewed today: No    Differential Diagnosis: Flu; Acute viral syndrome      ASSESSMENT     No diagnosis found.         PLAN      PLAN: Tylenol / ibuprofen; Push fluids; Nasal saline; Nasal steroid; Mucinex DM; F/U with PCP in 2-3 days.         No orders of the defined types were placed in this encounter.    Requested Prescriptions      No prescriptions requested or ordered in this encounter       Discussed results and diagnosis with patient/family.  Reviewed warning signs for worsening condition, as well as, indications for follow-up with primary care physician and return to urgent care clinic.   Patient/family expressed understanding of instructions.     An After Visit Summary was provided to the patient.

## 2020-08-25 ENCOUNTER — Ambulatory Visit (INDEPENDENT_AMBULATORY_CARE_PROVIDER_SITE_OTHER): Payer: PRIVATE HEALTH INSURANCE

## 2020-08-25 ENCOUNTER — Encounter (INDEPENDENT_AMBULATORY_CARE_PROVIDER_SITE_OTHER): Payer: Self-pay | Admitting: Internal Medicine

## 2020-08-25 ENCOUNTER — Ambulatory Visit (INDEPENDENT_AMBULATORY_CARE_PROVIDER_SITE_OTHER): Payer: PRIVATE HEALTH INSURANCE | Admitting: Internal Medicine

## 2020-08-25 VITALS — BP 133/84 | HR 68 | Temp 97.6°F | Resp 16 | Ht 71.0 in | Wt 190.0 lb

## 2020-08-25 DIAGNOSIS — R0602 Shortness of breath: Secondary | ICD-10-CM

## 2020-08-25 DIAGNOSIS — J209 Acute bronchitis, unspecified: Secondary | ICD-10-CM

## 2020-08-25 DIAGNOSIS — R062 Wheezing: Secondary | ICD-10-CM

## 2020-08-25 DIAGNOSIS — Z8669 Personal history of other diseases of the nervous system and sense organs: Secondary | ICD-10-CM

## 2020-08-25 DIAGNOSIS — G20 Parkinson's disease: Secondary | ICD-10-CM

## 2020-08-25 DIAGNOSIS — Z79899 Other long term (current) drug therapy: Secondary | ICD-10-CM

## 2020-08-25 DIAGNOSIS — Z8616 Personal history of COVID-19: Secondary | ICD-10-CM

## 2020-08-25 MED ORDER — PREDNISONE 20 MG PO TABS
40.0000 mg | ORAL_TABLET | Freq: Every day | ORAL | 0 refills | Status: AC
Start: 2020-08-25 — End: 2020-08-30

## 2020-08-25 MED ORDER — AZITHROMYCIN 250 MG PO TABS
ORAL_TABLET | ORAL | 0 refills | Status: AC
Start: 2020-08-25 — End: 2020-08-30

## 2020-08-25 NOTE — Progress Notes (Signed)
[facility]  PROGRESS NOTE     Patient: Clinton Stephens   Date: 08/25/2020   MRN: [id number]       MCGUIRE GASPARYAN is a 60 y.o. male      HISTORY     History obtained from: Patient    Chief Complaint   Patient presents with    Cough     Cough and congestion started 08/16/20. Symptoms not getting better. No pain meds today. Covid vaccinated 3x. Had covid 07/11/20.         60 yo M with Hx of Parkinson's on meds.  Hx of COVID 19 in May, 2022.  Seen here on 6-20 for Acute viral syndrome.  Returns C/O SOB and uncontrollable cough.  + SOB and feels winded.  Has been taking OTC meds Mucinex DM and Advil with minimal relief.        Review of Systems   All other systems reviewed and are negative.  As above.    History:    Pertinent Past Medical, Surgical, Family and Social History were reviewed.        Current Outpatient Medications:     amantadine (SYMMETREL) 100 MG tablet, , Disp: , Rfl:     carbidopa-levodopa (SINEMET) 25-100 MG per tablet, Take by mouth, Disp: , Rfl:     Rasagiline Mesylate 1 MG Tab, , Disp: , Rfl:     No Known Allergies    Medications and Allergies reviewed.    PHYSICAL EXAM     Vitals:    08/25/20 0831   BP: 133/84   Pulse: 68   Resp: 16   Temp: 97.6 F (36.4 C)   TempSrc: Tympanic   SpO2: 97%   Weight: 86.2 kg (190 lb)   Height: 1.803 m (5\' 11" )       Physical Exam  Constitutional:       General: He is in acute distress.      Appearance: Normal appearance. He is not ill-appearing, toxic-appearing or diaphoretic.      Comments: Appears mildly SOB.   HENT:      Head: Normocephalic and atraumatic.   Cardiovascular:      Rate and Rhythm: Normal rate and regular rhythm.   Pulmonary:      Effort: Pulmonary effort is normal. No respiratory distress.      Breath sounds: No stridor. Wheezing and rhonchi present. No rales.      Comments: L sided wheezing.  Sounds tight.  Neurological:      Mental Status: He is alert.   Vitals and nursing note reviewed.   Physical Exam  Constitutional:       General: Not in  acute distress.     Appearance: Normal appearance. Not ill-appearing or toxic-appearing.   HENT:      Head: Normocephalic and atraumatic.   Eyes:      Conjunctiva/sclera: Conjunctivae normal.   Neck:      Musculoskeletal: Normal range of motion.   Respiratory:      Normal effort. Able to speak in full sentences.  Neurological:      Mental Status: Alert and oriented.  Psychiatric:         Mood and Affect: Mood normal.         Behavior: Behavior normal.       UCC COURSE     There were no labs reviewed with this patient during the visit.    X-Ray  The following X-ray studies were ordered, visualized and  independently interpreted by me. Results were discussed with the patient/family.     XR Chest 2 Views    Result Date: 08/25/2020  CLINICAL INDICATION: Cough and fever x9 days. Wheezing and shortness of breath. COMPARISON: None. INTERPRETATION: Frontal and lateral views of the chest obtained. Cardiomediastinal contour within normal limits for age. Clear lungs and sharp sulci.      No acute cardiopulmonary process. Mitali Bapna, MD  08/25/2020 8:59 AM    No current facility-administered medications for this visit.       PROCEDURES     Procedures    MEDICAL DECISION MAKING     History, physical, labs/studies most consistent with acute bronchitis with wheezing as the diagnosis.    Chart Review:  Prior PCP, Specialist and/or ED notes reviewed today: No  Prior labs/images/studies reviewed today: No    Differential Diagnosis: Pneumonia; bronchitis with wheezing - viral, bacterial, allergic      ASSESSMENT     Encounter Diagnoses   Name Primary?    Acute bronchitis with wheezing Yes    SOB (shortness of breath)             PLAN      PLAN:  Watch and wait on abx; Prednisone burst; Tylenol / ibuprofen; Push fluids; Nasal saline; Nasal steroid; Mucinex DM; F/U with PCP in 2-3 days.   High interaction of any inhalers with sympathomimetics and his med Risagiline, therefore unable to prescribe.  Z-pack precautionary, also given his Hx  of Parkinson's.      Orders Placed This Encounter   Procedures    XR Chest 2 Views     Requested Prescriptions      No prescriptions requested or ordered in this encounter       Discussed results and diagnosis with patient/family.  Reviewed warning signs for worsening condition, as well as, indications for follow-up with primary care physician and return to urgent care clinic.   Patient/family expressed understanding of instructions.     An After Visit Summary was provided to the patient.

## 2020-08-25 NOTE — Patient Instructions (Signed)
Take all prescriptions as directed.  Watch and wait on abx.  I recommend a probiotic during and for 4-6 weeks after antibiotics.  Tylenol and or ibuprofen as directed on the bottles.  Nasal saline several times a day.  Nasal steroid.  Mucinex DM.  Push fluids with electrolytes.  To the Emergency Room or call 911 for any chest pain, difficulty breathing, heart racing, any concern.  Follow up with your doctor in 2-3 days.

## 2021-05-29 IMAGING — CT CT CALCIUM SCORING
1 series · 15 of 20 positions shown, 19 images · non-contrast
Comparison: There are no previous exams available for comparison.

________________________________________________________________________________________________ 
CT CALCIUM SCORING, 05/29/2021 [DATE]:
INDICATION: Evaluate coronary artery calcification.

[Series 2: cascoreseq 3.0 b35s 60% · axial · 0.44mm/px · z∈[-251,-98]mm · 15 of 114 slices shown, 19 images]
[im 6/114  vessel]
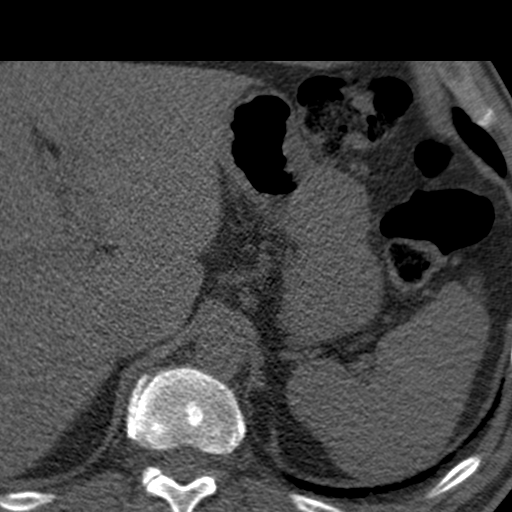
[im 6/114  lung]
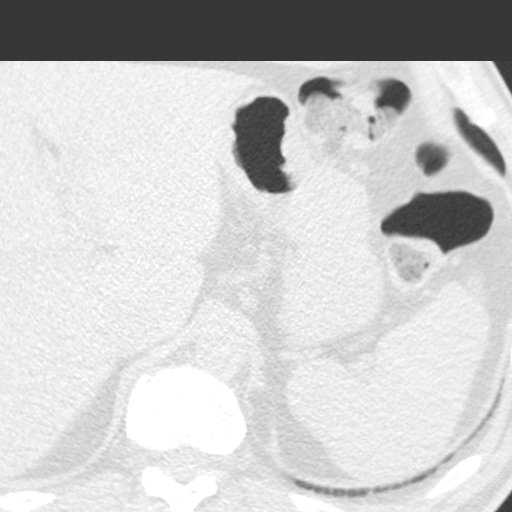
[im 12/114  vessel]
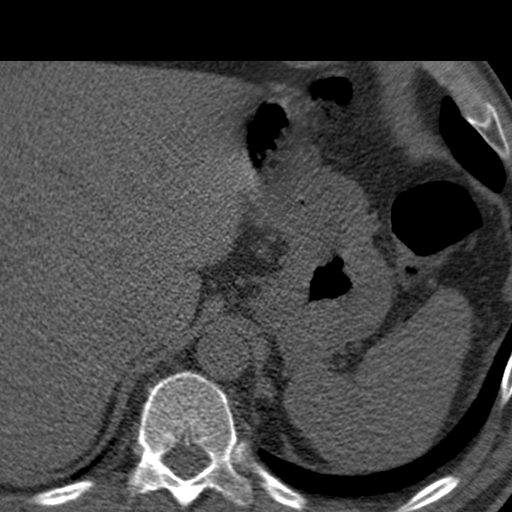
[im 24/114  vessel]
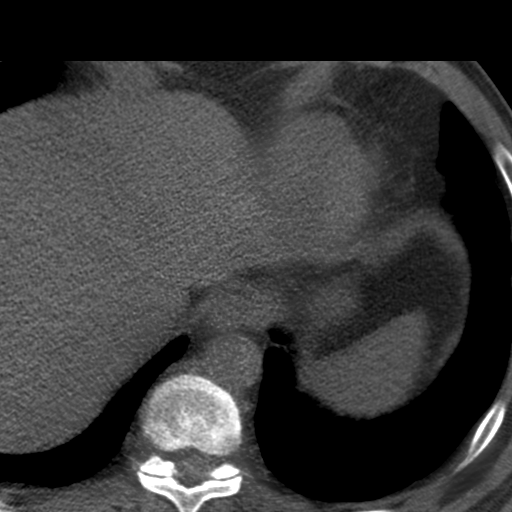
[im 30/114  vessel]
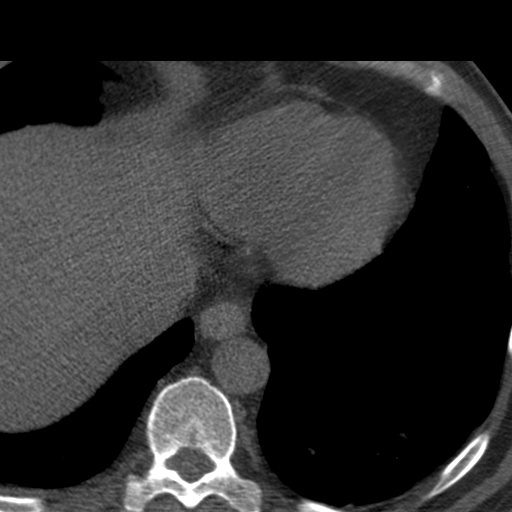
[im 36/114  vessel]
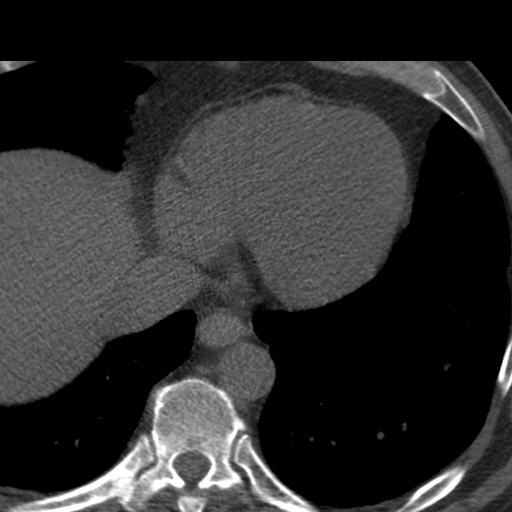
[im 36/114  lung]
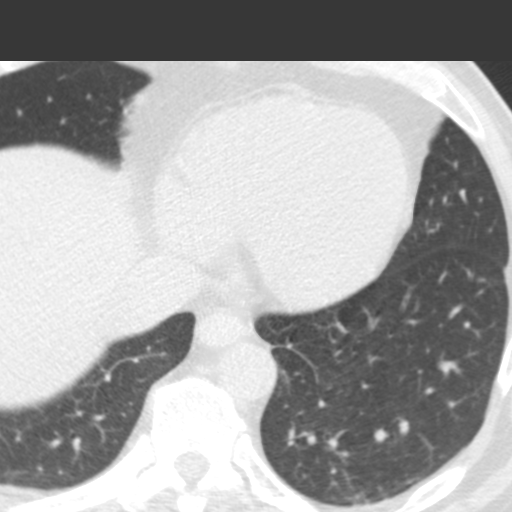
[im 42/114  vessel]
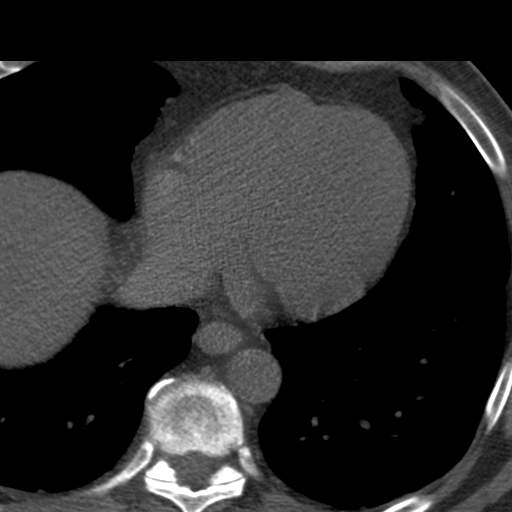
[im 48/114  vessel]
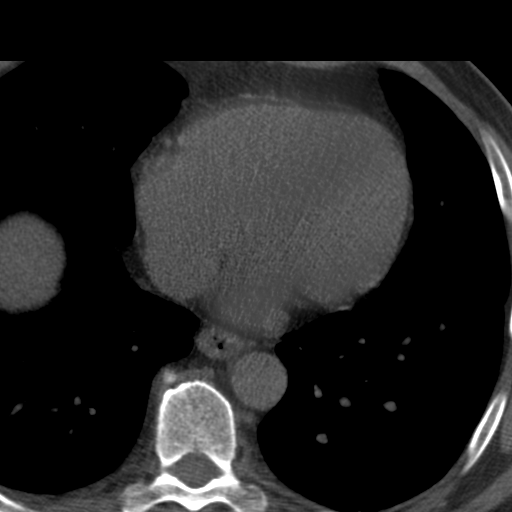
[im 60/114  vessel]
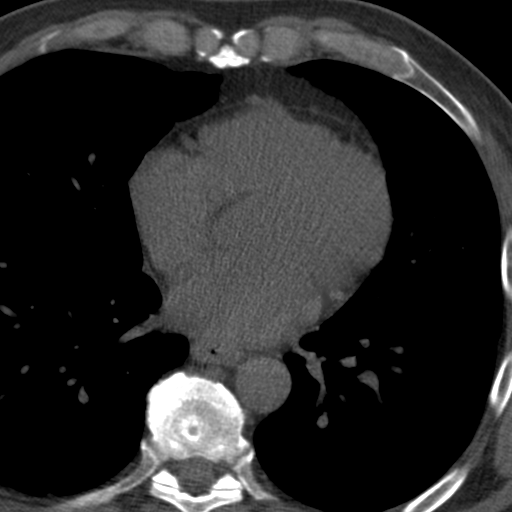
[im 66/114  vessel]
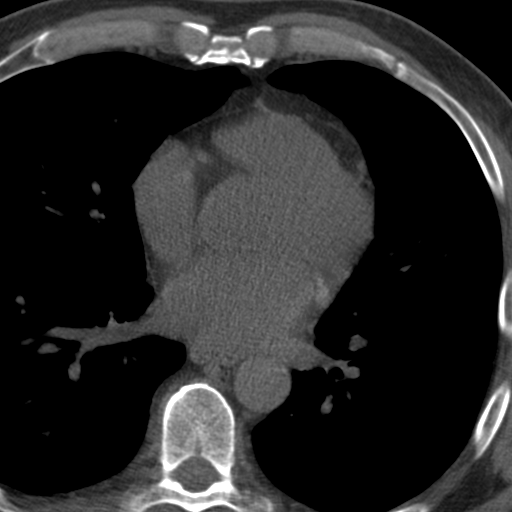
[im 66/114  lung]
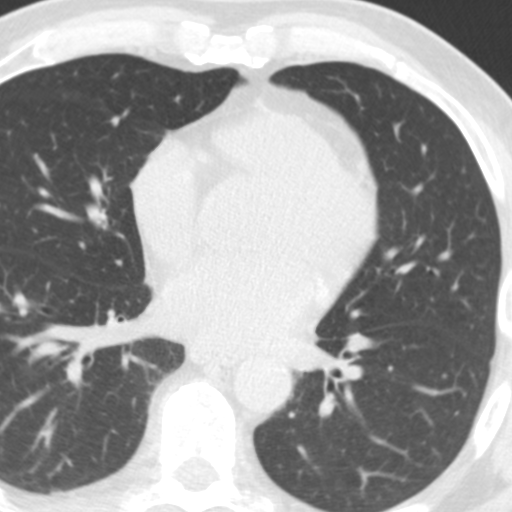
[im 72/114  vessel]
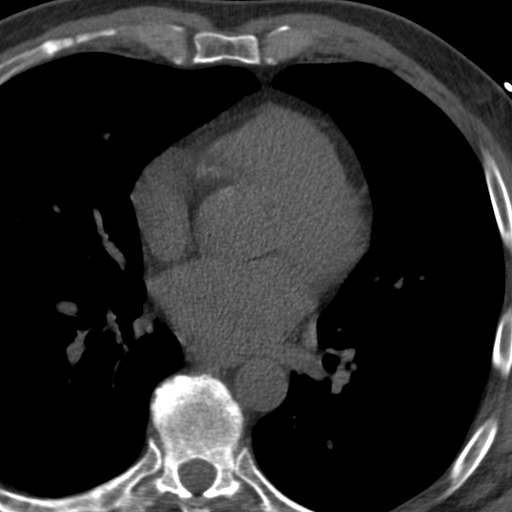
[im 78/114  vessel]
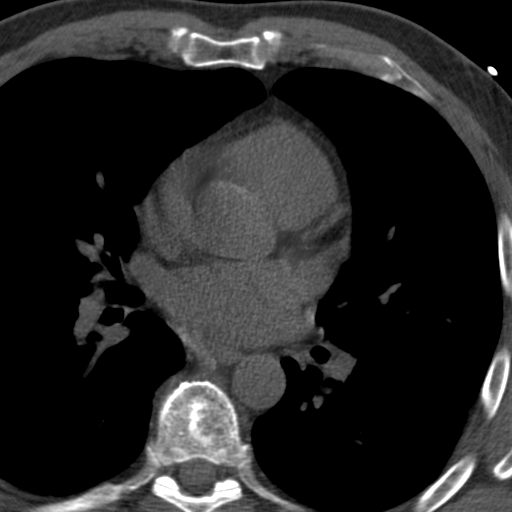
[im 84/114  vessel]
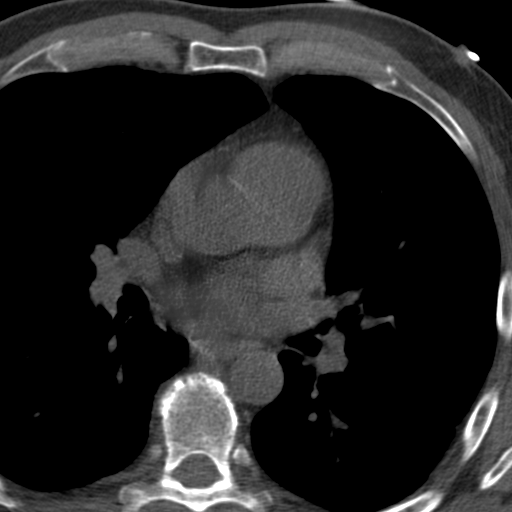
[im 96/114  vessel]
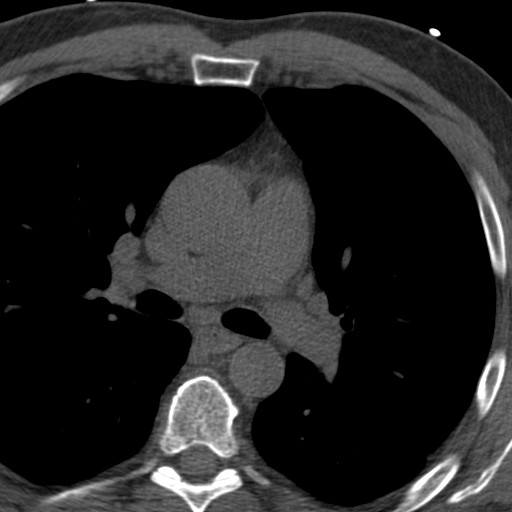
[im 96/114  lung]
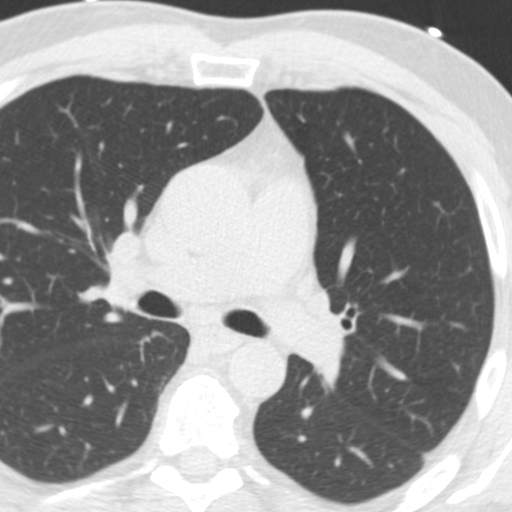
[im 102/114  vessel]
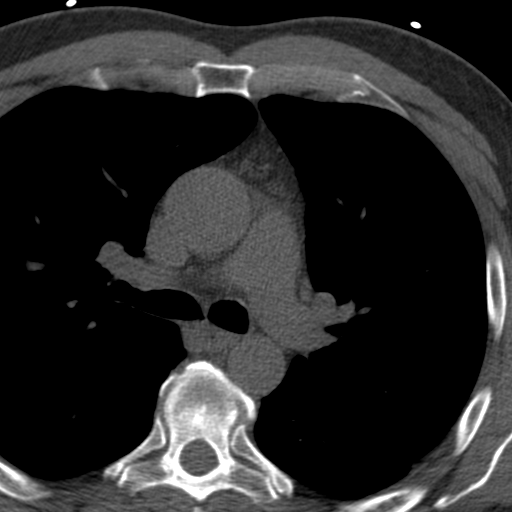
[im 108/114  vessel]
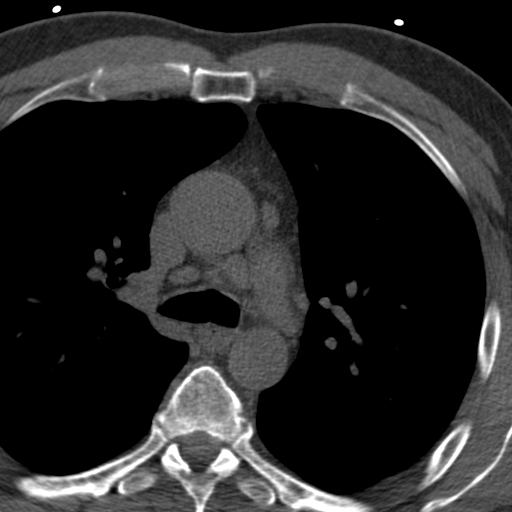

[15 of 20 positions shown; findings below may reference images not displayed]

FINDINGS: Visualized lungs are clear. Degenerative change of the spine.
IMPRESSION: Total Calcium Score: 6 
Calcium score                                     Presence of Plaque 
0                                                    No evidence of plaque 
1-10                                               Minimal evidence of plaque 
11-100                                            Mild evidence of plaque 
101-400                                          Moderate evidence of plaque 
Over 400                                        Extensive evidence of plaque     

Calcium scoring sheets to follow. 
RADIATION DOSE REDUCTION: All CT scans are performed using radiation dose 
reduction techniques, when applicable.  Technical factors are evaluated and 
adjusted to ensure appropriate moderation of exposure.  Automated dose 
management technology is applied to adjust the radiation doses to minimize 
exposure while achieving diagnostic quality images.

## 2021-07-08 IMAGING — DX LUMBAR SPINE 3 VIEW
1 series · 3 of 3 positions shown · non-contrast
Comparison: None prior.

________________________________________________________________________________________________ 
LUMBAR SPINE 3 VIEW, 07/08/2021 [DATE]: 
CLINICAL INDICATION: Low back pain.

[Series 1: AP · U · 0.14mm/px · 3 of 3 slices shown]
[im 1/3]
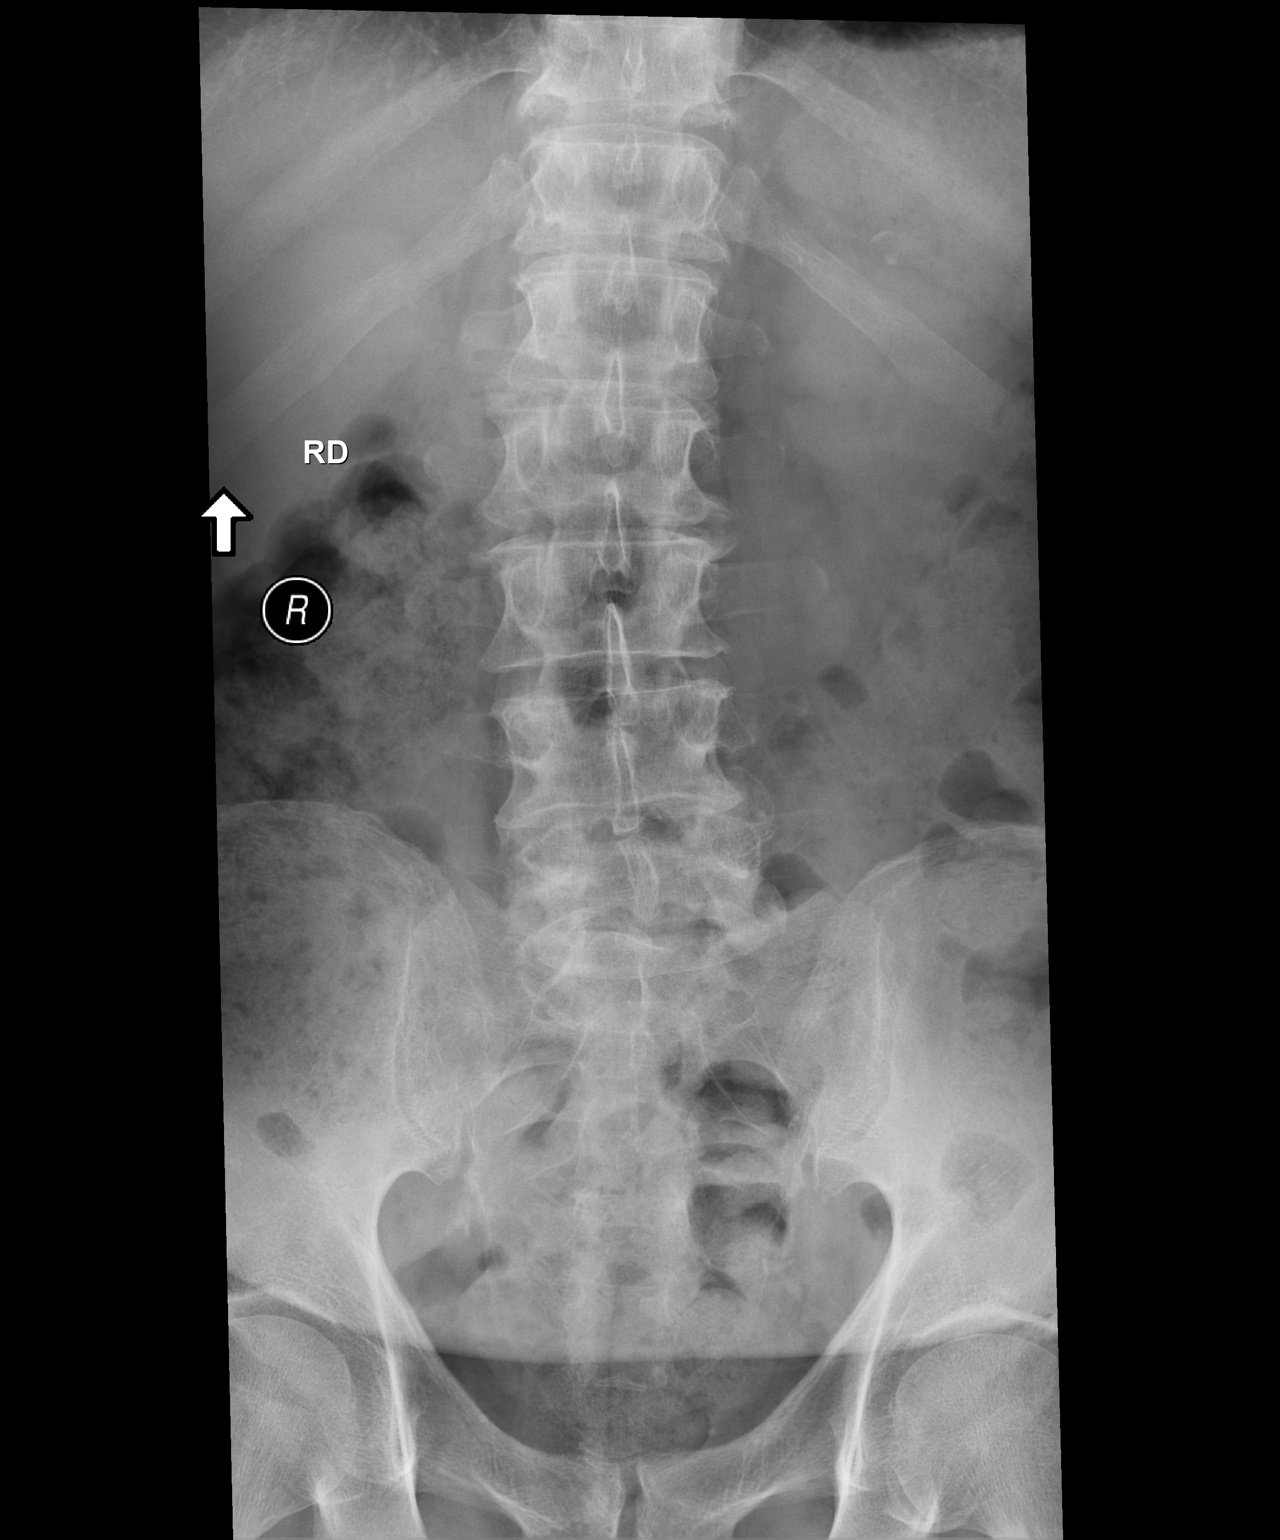
[im 2/3]
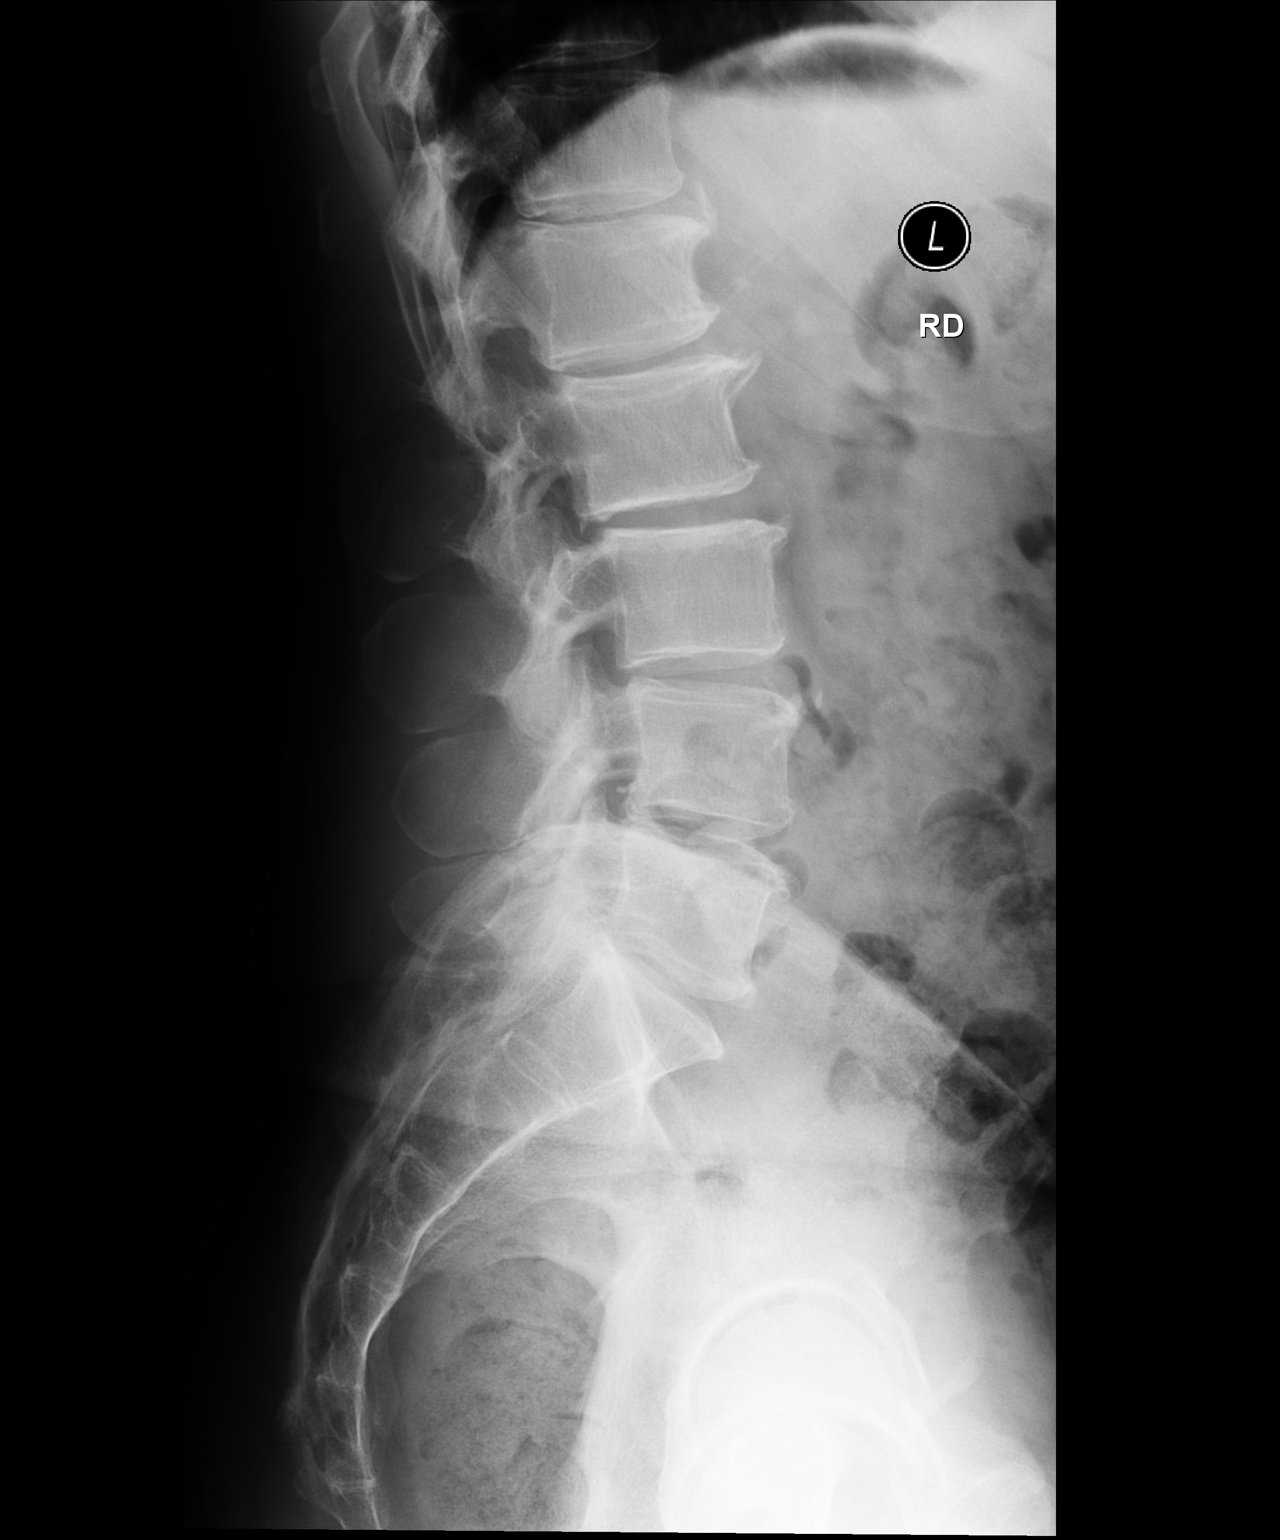
[im 3/3]
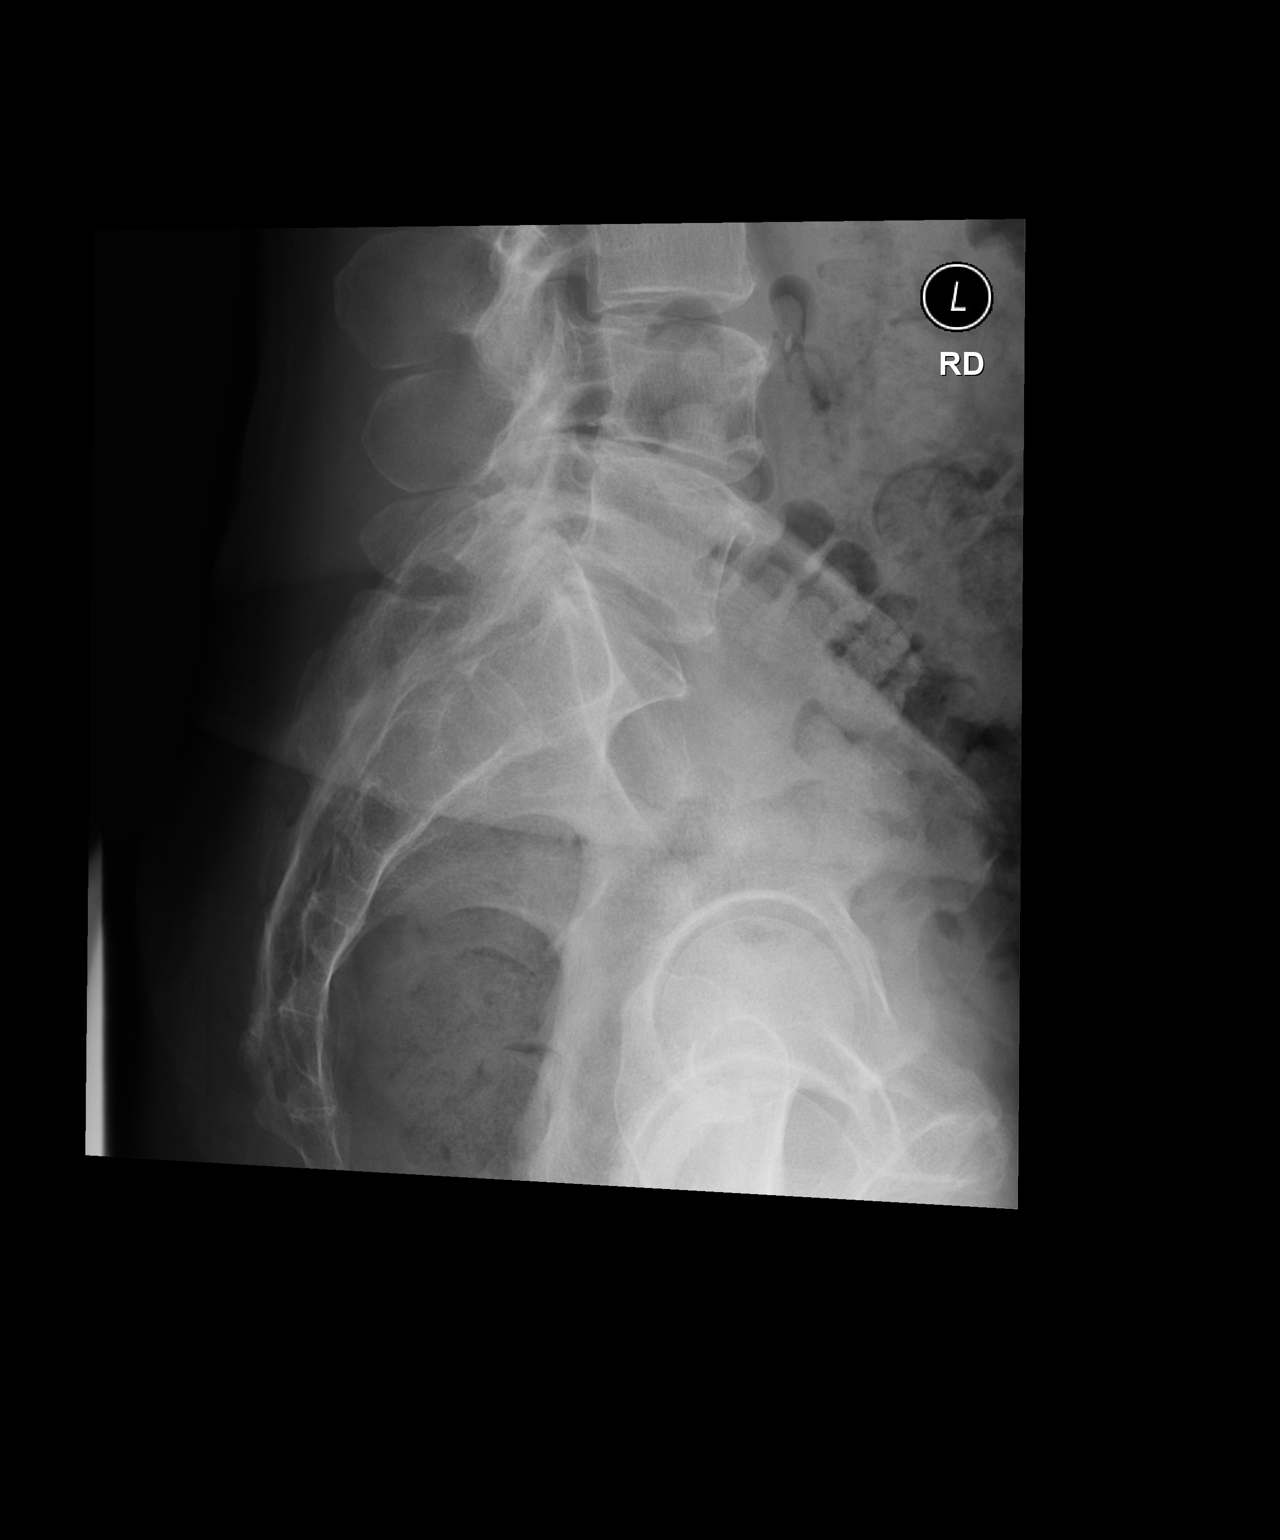

[3 of 3 positions shown; findings below may reference images not displayed]

FINDINGS: Scattered disc height loss and osteophytic spurring. Mild 
dextrocurvature. Lower lumbar facet hypertrophy. No vertebral body fracture. No 
spondylolisthesis. Included hip joint spaces appear preserved. Soft tissues are 
negative.
IMPRESSION: Moderately advanced spondylotic changes lumbar spine. If symptoms persist, 
consideration could be made for MR exam.

## 2021-08-22 IMAGING — MR MRI LUMBAR SPINE WITHOUT CONTRAST
4 of 6 series · 13 of 48 positions shown · IV contrast (gadolinium)
Comparison: Lumbar radiograph 05/08/2021

________________________________________________________________________________________________ 
MRI LUMBAR SPINE WITHOUT CONTRAST, 08/22/2021 [DATE]: 
CLINICAL INDICATION: Low back pain, back spasm
TECHNIQUE: Sagittal T1, Sagittal T2, Sagittal STIR, Axial T1 and Axial T2 MR 
images of the lumbar spine were performed without intravenous gadolinium 
enhancement.

[Series 101: survey · axial · 10.0mm · 1.39mm/px · z∈[-15,+199]mm · 4 of 9 slices shown]
[im 1/9]
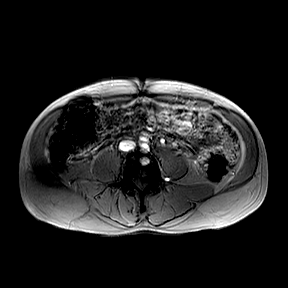
[im 3/9]
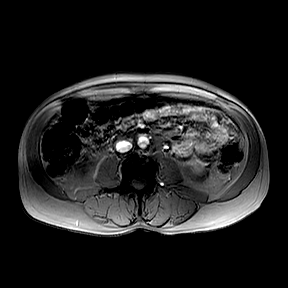
[im 6/9]
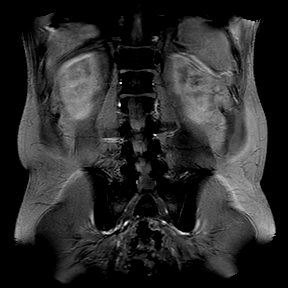
[im 9/9]
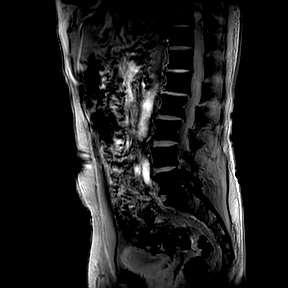

[Series 201: t2w_cor-surv · coronal · 6.0mm · 0.50mm/px · 2 of 5 slices shown]
[im 1/5]
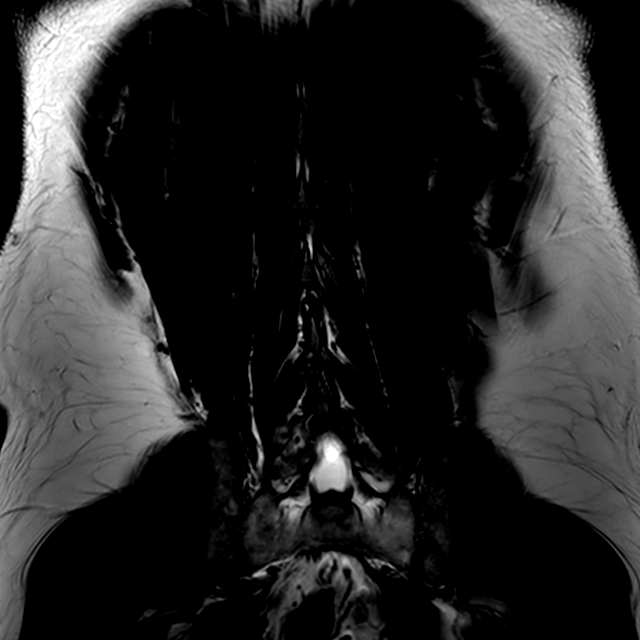
[im 5/5]
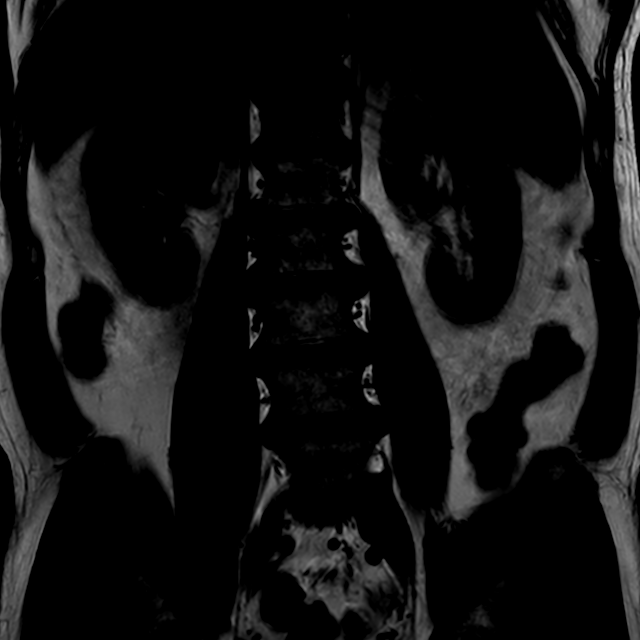

[Series 301: t2w_tse sag · sagittal · 4.0mm · 0.28mm/px · 4 of 17 slices shown]
[im 1/17]
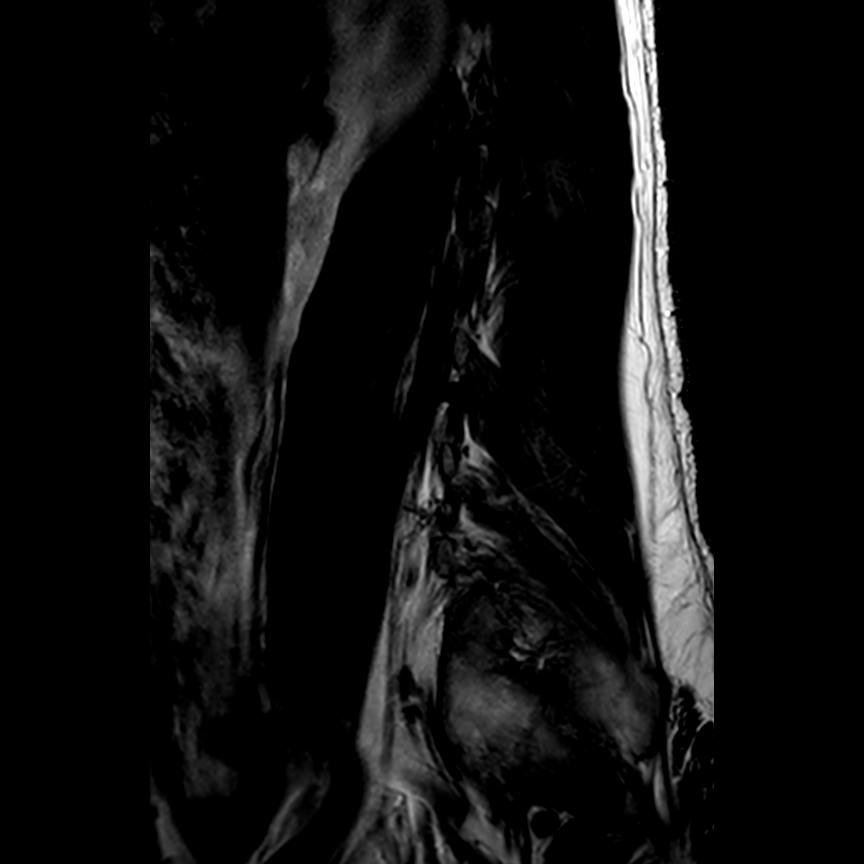
[im 3/17]
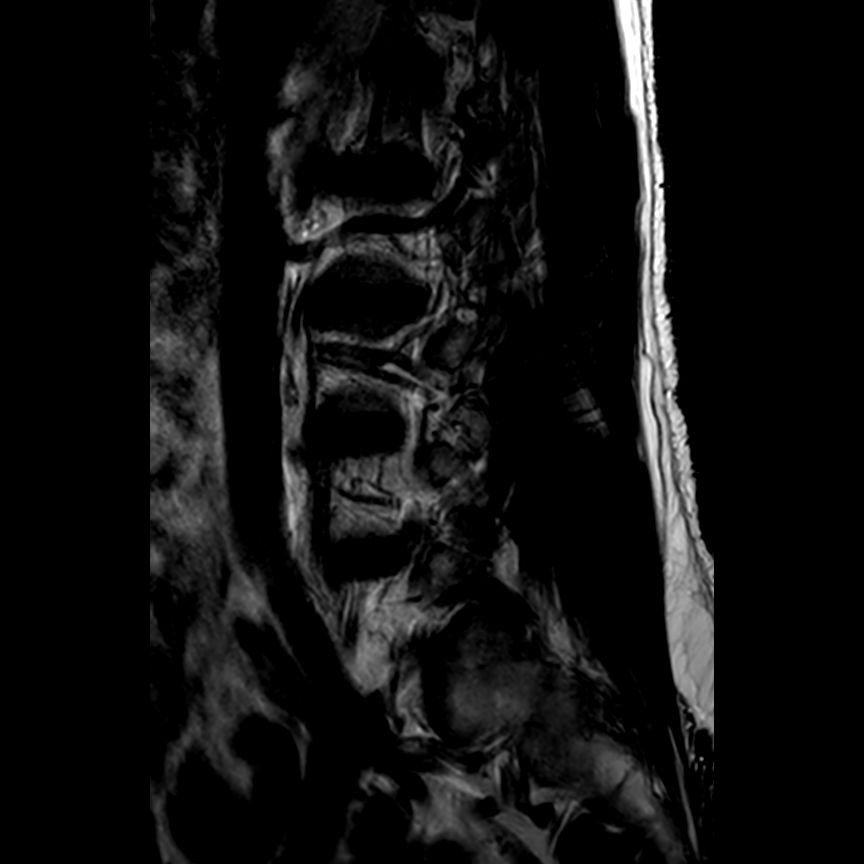
[im 9/17]
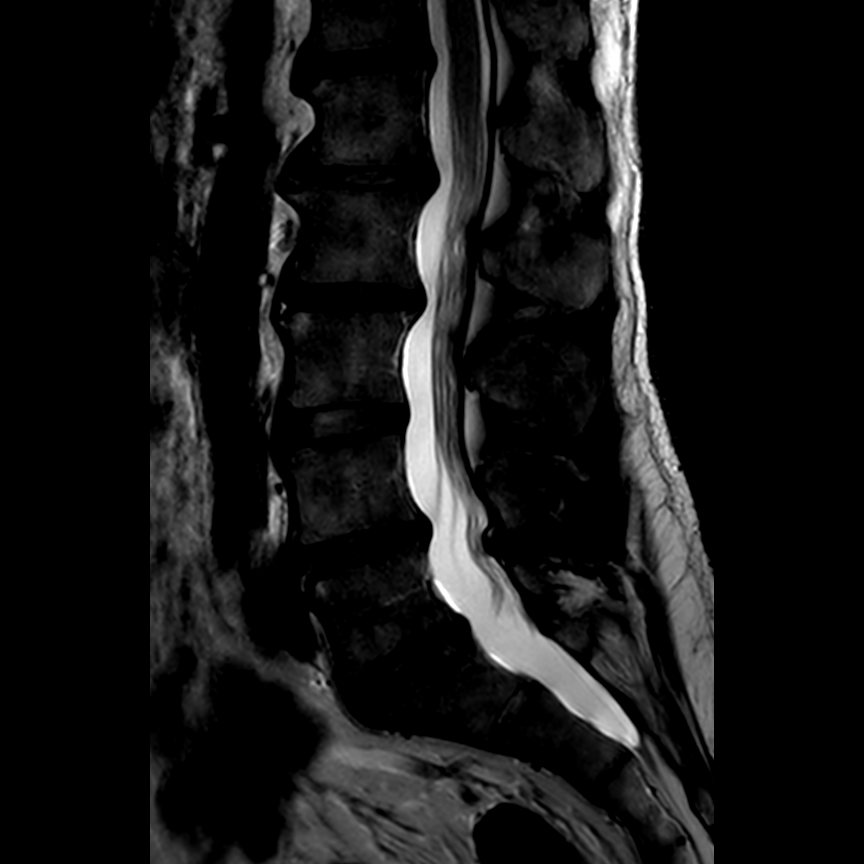
[im 15/17]
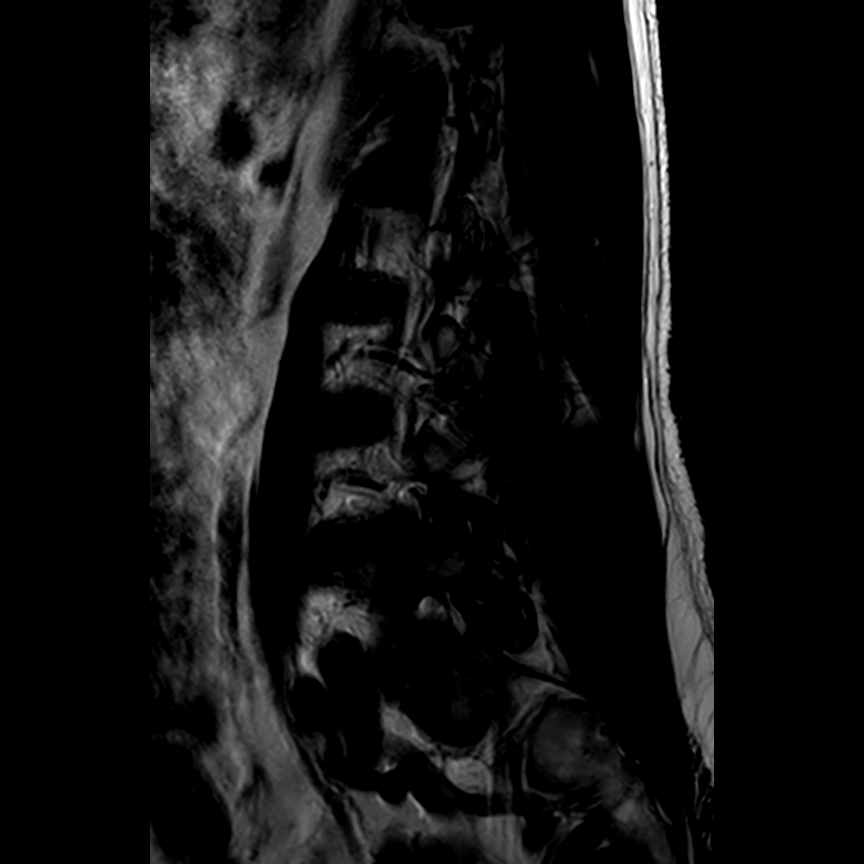

[Series 401: t1w_tse sag · sagittal · 4.0mm · 0.43mm/px · 3 of 17 slices shown]
[im 3/17]
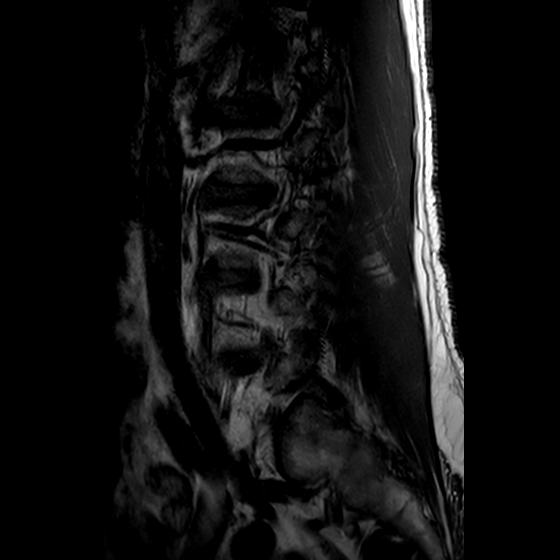
[im 9/17]
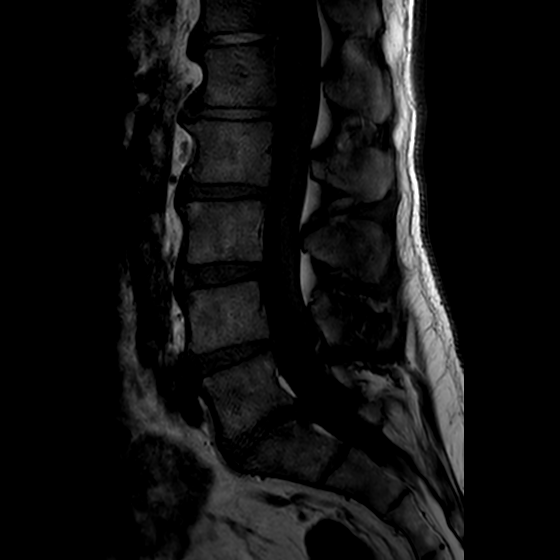
[im 15/17]
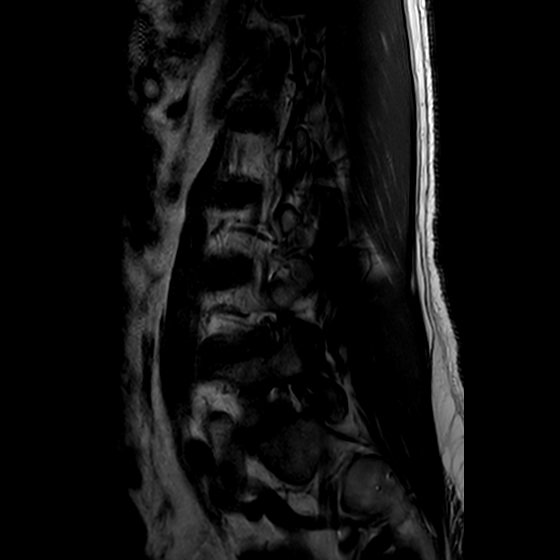

[13 of 48 positions shown; findings below may reference images not displayed]

FINDINGS: Lumbar vertebral heights are intact. There is moderate to marked disc 
narrowing at L5-S1 and along the posterior L4-5 interspace, moderate narrowing 
at L2-3, L1-2 and T12-L1. Conus terminates opposite L1. There is mild lumbar 
dextrocurvature. 
At L5-S1 there is mild disc bulge. The canal is open. There is mild right 
foraminal stenosis. 
At L4-5 there is mild disc bulge and mild to moderate facet change with 
ligamentous thickening. There is borderline to mild canal stenosis. There is 
left far lateral disc bulge contributing to mild to moderate left foraminal 
stenosis, appearing to touch but not deforming the distal left L4 nerve root. 
Mild right foraminal stenosis due to lateral disc bulge. These findings could 
worsen with weightbearing. 
At L3-4, L2-3 and L1-2 there is no significant canal or foraminal stenosis. Mild 
disc bulging at the upper 2 lumbar levels. 
Motion artifact limits detail. There is mild Modic type I change along the 
anterior L4-5 and L2-3 interspaces.
IMPRESSION: Degenerative changes. There is mild to moderate bilateral foraminal stenosis at 
L4-5 encroaching on the distal L4 nerve roots, and borderline-mild canal 
stenosis. These findings could worsen with weightbearing. 
Mild Modic type I change at L2-3 and L4-5. 
Mild lumbar dextrocurvature. 
No evidence for fracture or spinal malignancy. 
Todays study is mildly limited by motion artifact.

## 2022-07-05 IMAGING — MR MRI BRAIN WITHOUT CONTRAST
8 of 12 series · 33 of 48 positions shown · IV contrast (gadolinium)
Comparison: None.

________________________________________________________________________________________________ 
MRI BRAIN WITHOUT CONTRAST, 07/05/2022 [DATE]: 
CLINICAL INDICATION: Right hand tremor.
TECHNIQUE: Multiplanar, multiecho position MR images of the brain were performed 
without intravenous gadolinium enhancement. Patient was scanned on a 3.0 T 
magnet.

[Series 101: survey · axial · 10.0mm · 0.98mm/px · 1 of 5 slices shown]
[im 1/5]
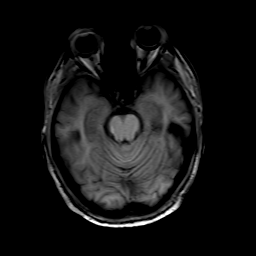

[Series 203: dadc map · axial · 4.0mm · 1.07mm/px · z∈[-52,+93]mm · 2 of 28 slices shown (1 of 2)]
[im 1/28]
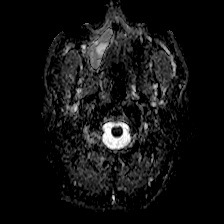
[im 28/28]
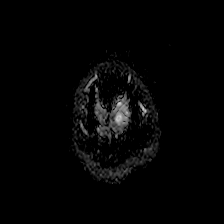

[Series 204: isob (id) · axial · 4.0mm · 1.07mm/px · z∈[-52,+93]mm · 2 of 30 slices shown (1 of 2)]
[im 1/30]
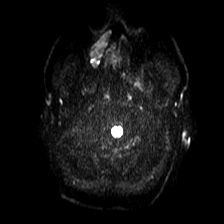
[im 30/30]
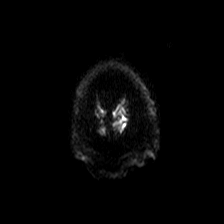

[Series 303: dadc map · coronal · 4.0mm · 0.81mm/px · 4 of 36 slices shown (2 of 2)]
[im 1/36]
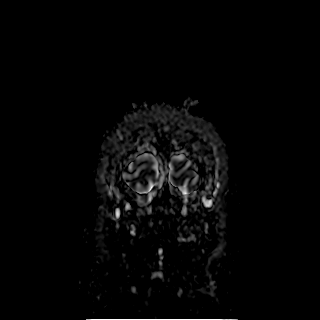
[im 12/36]
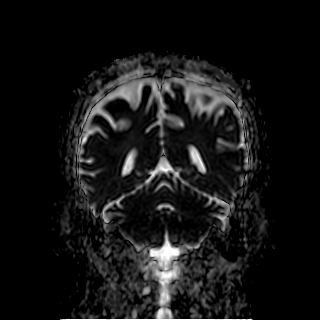
[im 24/36]
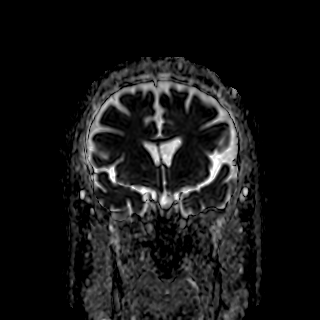
[im 36/36]
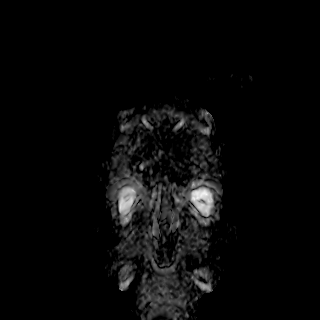

[Series 304: isob (id) · coronal · 4.0mm · 0.81mm/px · 2 of 36 slices shown (2 of 2)]
[im 1/36]
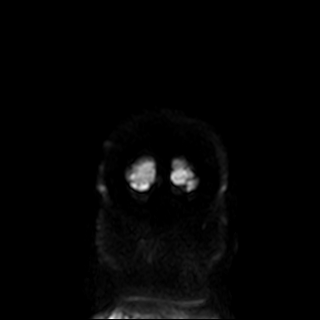
[im 12/36]
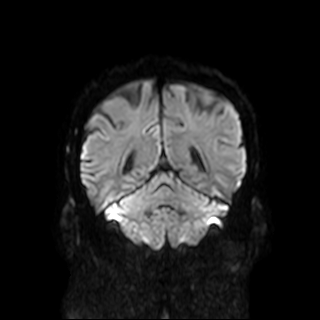

[Series 501: FLAIR fat-sat · axial · 5.0mm · 0.60mm/px · z∈[-52,+104]mm · 3 of 27 slices shown]
[im 1/27]
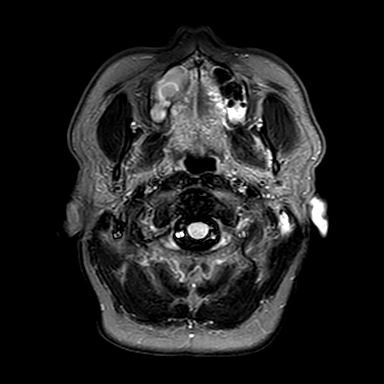
[im 14/27]
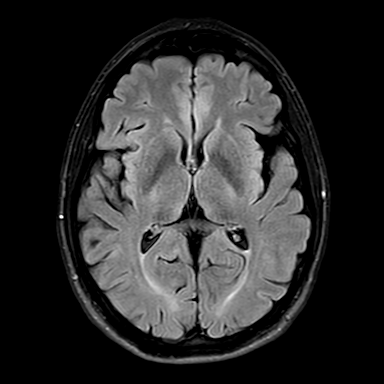
[im 27/27]
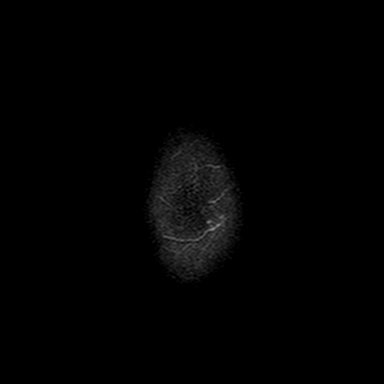

[Series 601: SWI · axial · 3.0mm · 0.53mm/px · z∈[-50,+99]mm · 11 of 100 slices shown (1 of 2)]
[im 1/100]
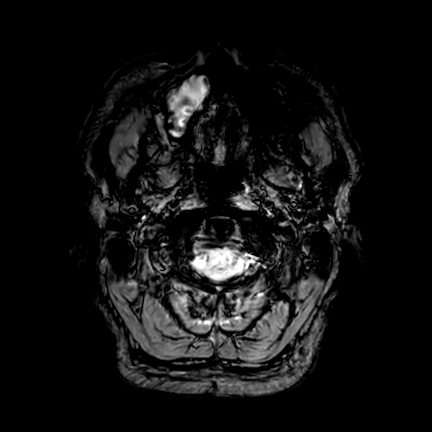
[im 10/100]
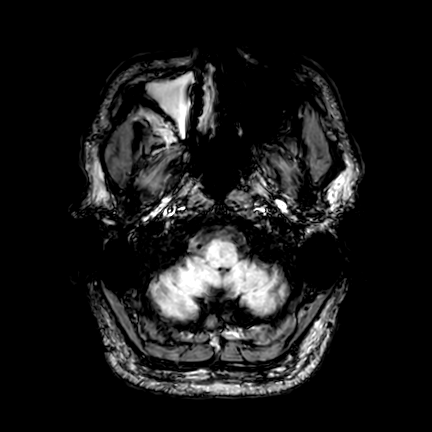
[im 20/100]
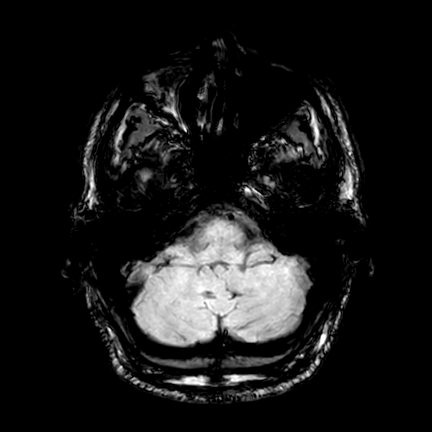
[im 30/100]
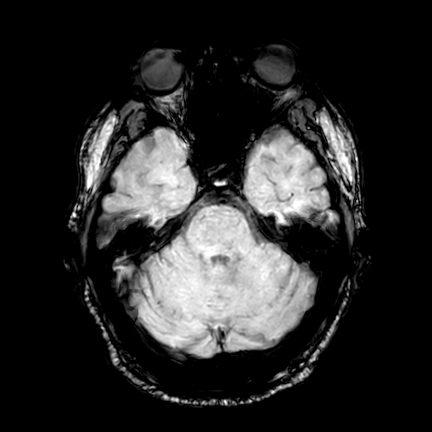
[im 40/100]
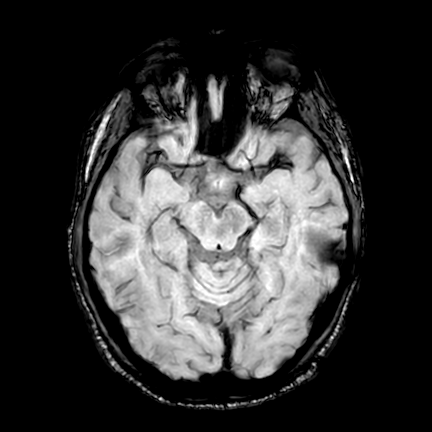
[im 50/100]
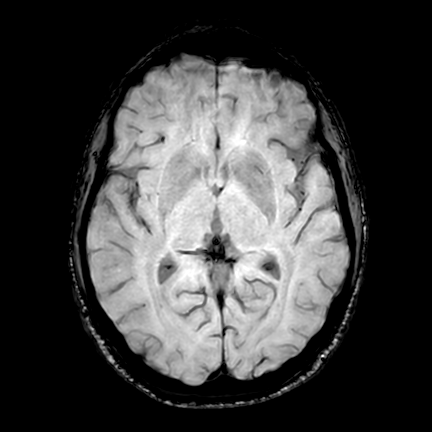
[im 60/100]
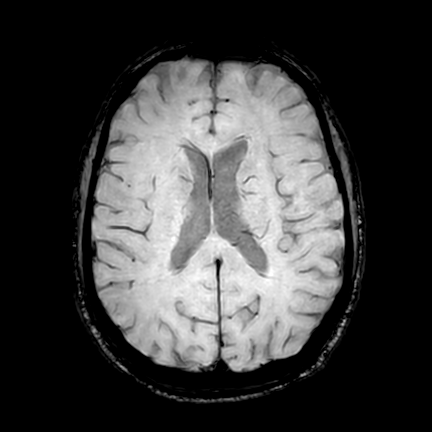
[im 70/100]
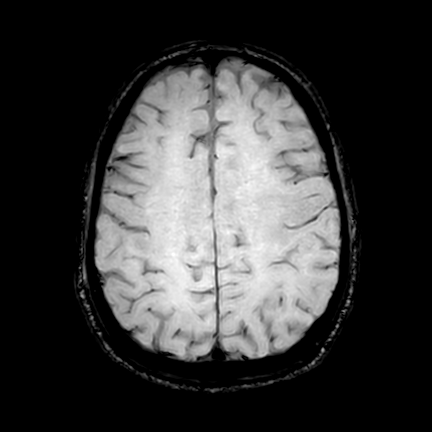
[im 80/100]
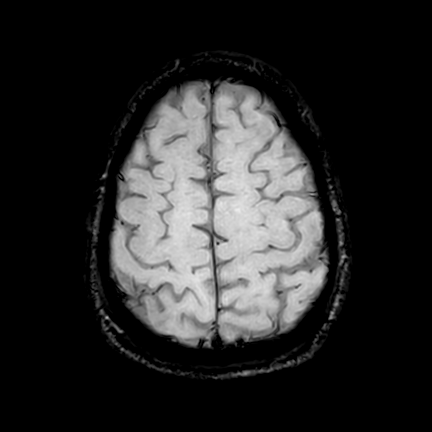
[im 90/100]
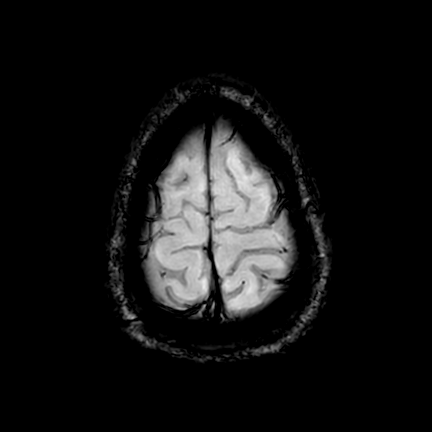
[im 100/100]
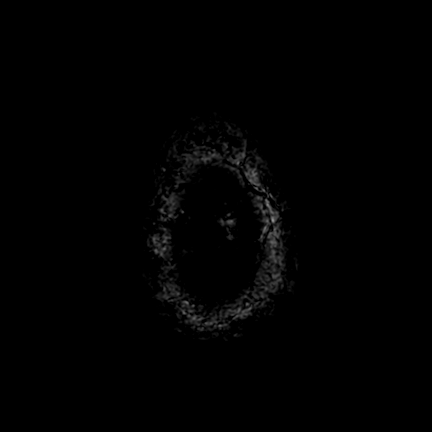

[Series 602: SWI · axial · 10.0mm · 0.53mm/px · z∈[-51,+103]mm · 8 of 78 slices shown (2 of 2)]
[im 1/78]
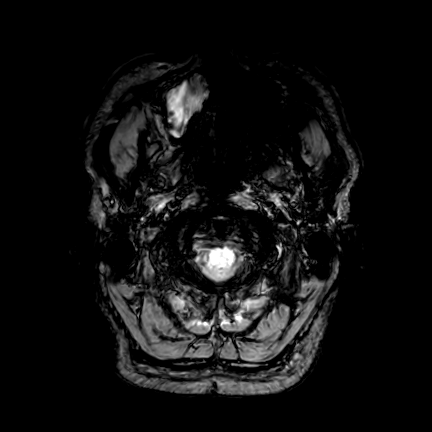
[im 12/78]
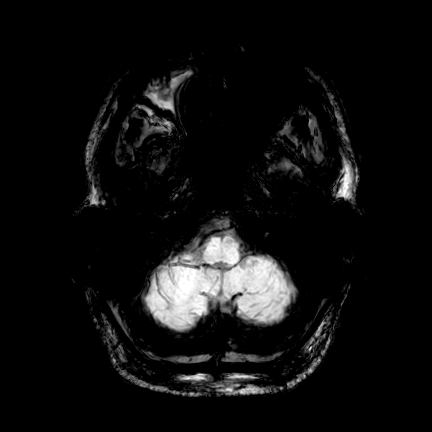
[im 23/78]
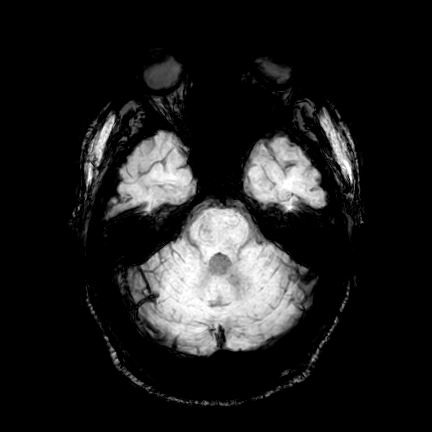
[im 34/78]
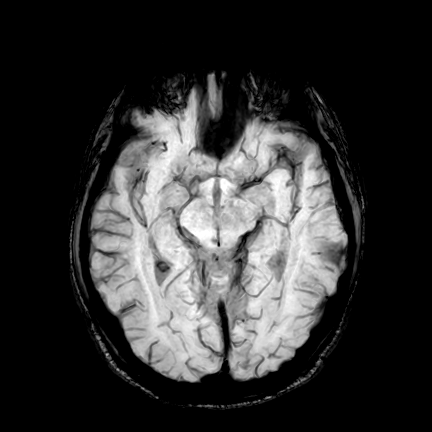
[im 45/78]
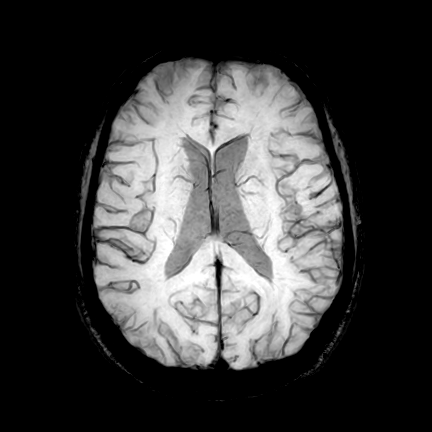
[im 56/78]
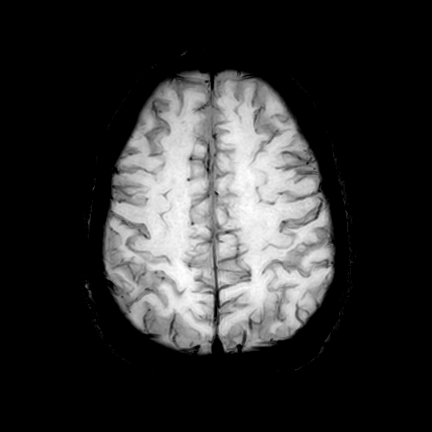
[im 67/78]
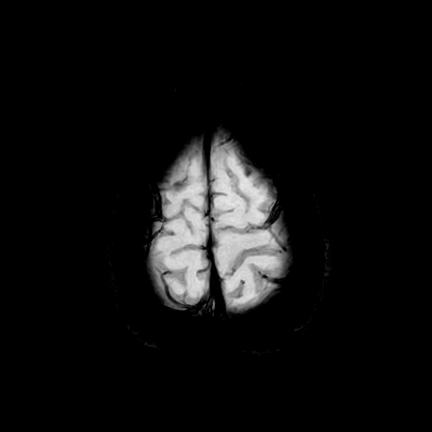
[im 78/78]
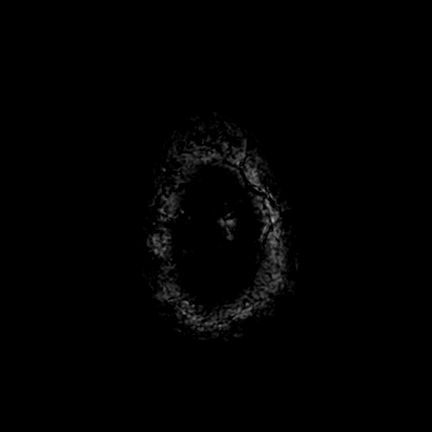

[33 of 48 positions shown; findings below may reference images not displayed]

FINDINGS: -------------------------------------------------------------------------------- 
------------------------- 
INTRACRANIAL: 
No acute ischemia. No abnormal foci of susceptibility artifact in the brain. 
Patency of intracranial vascular flow voids. No acute intracranial hemorrhage, 
mass effect, midline shift. Cerebral volume is age appropriate.  No 
hydrocephalus.  
-------------------------------------------------------------------------------- 
----------------------- 
OTHER: 
ORBITS/SINUSES/T-BONES:  Visualized orbits show no acute abnormality or mass.  
Mastoid air cells and middle ear cavities are grossly clear.  Complete 
opacification of the right maxillary sinus with retention cyst along the floor 
of the right maxillary sinus. Majority of material in the right maxillary sinus 
is T1 hyperintense and T2 isointense. 
MARROW SIGNAL/SOFT TISSUES: No focal suspect signal abnormality.  
-------------------------------------------------------------------------------- 
-------------------
IMPRESSION: 1.  Normal MRI appearance of the brain. 
2.  If there is clinical concern for Parkinsonian syndrome, consider DaTscan for 
further assessment. 
3.  Right maxillary sinus opacification with inspissated secretion versus fungal 
elements.

## 2022-09-09 IMAGING — CT CT SINUS WITHOUT CONTRAST
3 series · 14 of 47 positions shown, 16 images · non-contrast
Comparison: Brain MRI July 05, 2022.

________________________________________________________________________________________________ 
******** ADDENDUM #1 ********/n 
Count of known CT and Cardiac Nuclear Medicine studies performed in the previous 
12 months = 0. 
CT SINUS WITHOUT CONTRAST, 09/09/2022 [DATE]: 
CLINICAL INDICATION: Chronic Maxillary Sinusitis , sinus infection 3 months ago. 
Relieved by antibiotics. 
A search for DICOM formatted images was conducted for prior CT imaging studies 
completed at a non-affiliated media free facility.
TECHNIQUE: The paranasal sinuses were scanned without contrast on a high 
resolution CT scanner using dose reduction techniques. Routine MPR 
reconstructions were performed.

[Series 4: [address]mar[address] st · axial · 0.47mm/px · z∈[-159,-12]mm · 8 of 143 slices shown, 10 images]
[im 10/143  brain]
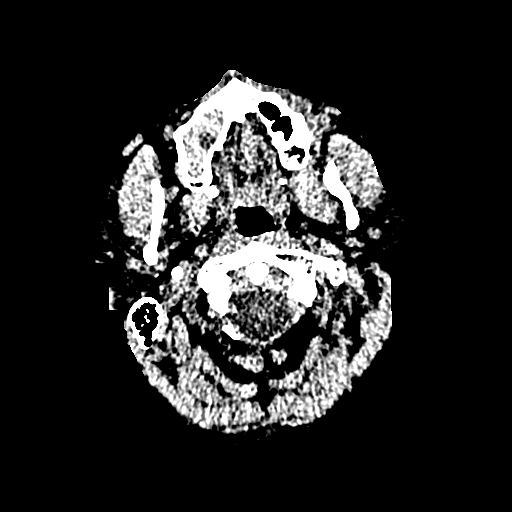
[im 10/143  bone]
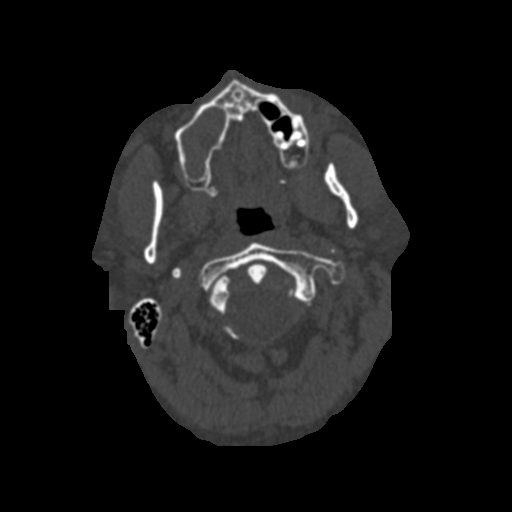
[im 30/143  bone]
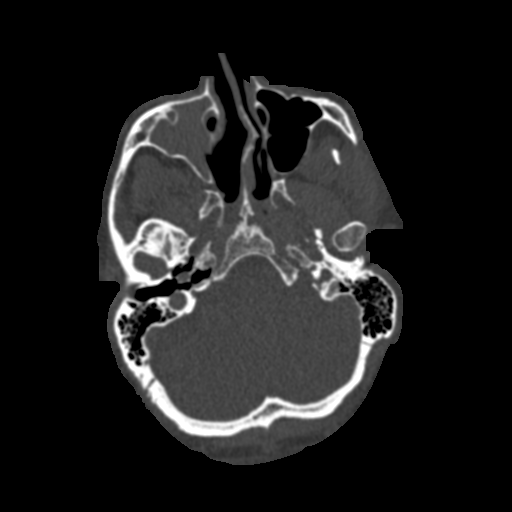
[im 45/143  bone]
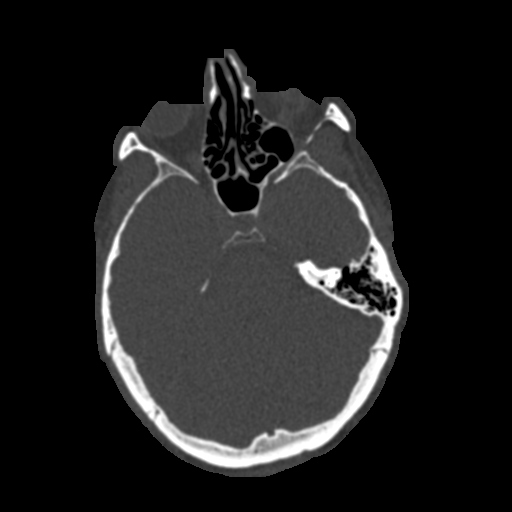
[im 64/143  bone]
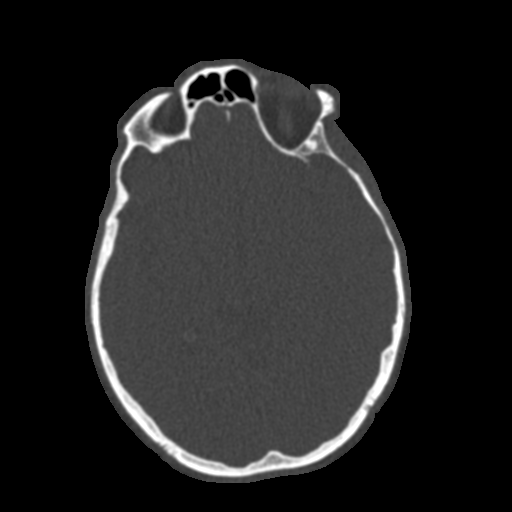
[im 79/143  brain]
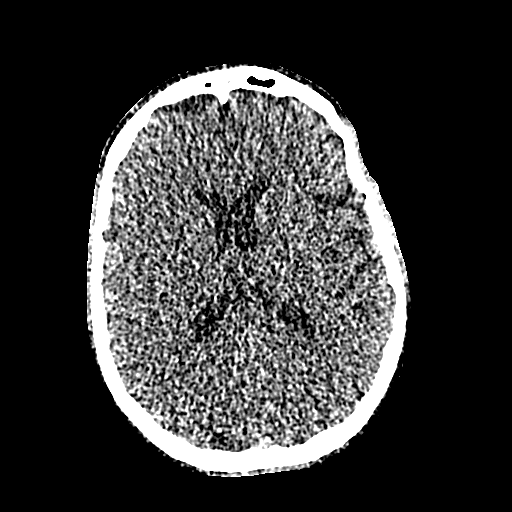
[im 79/143  bone]
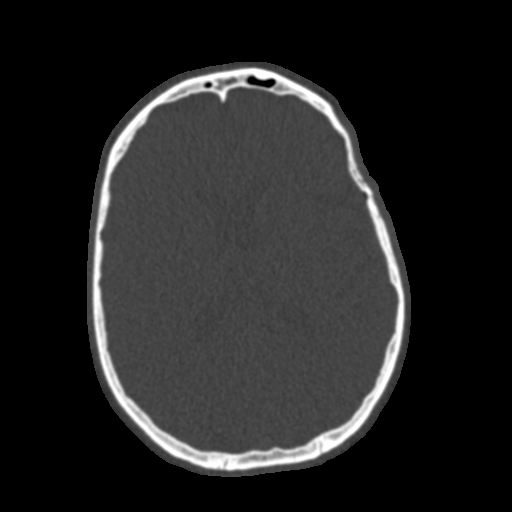
[im 98/143  bone]
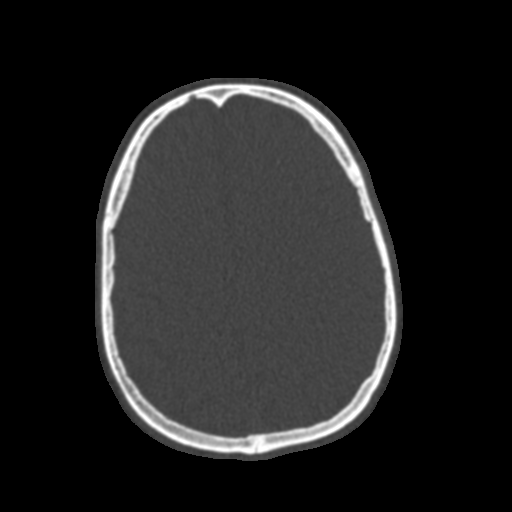
[im 113/143  bone]
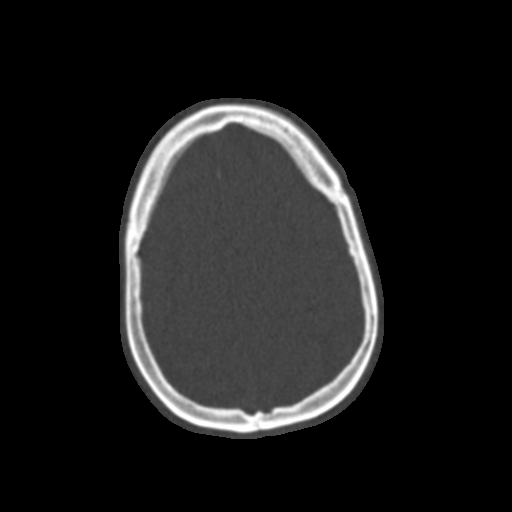
[im 133/143  bone]
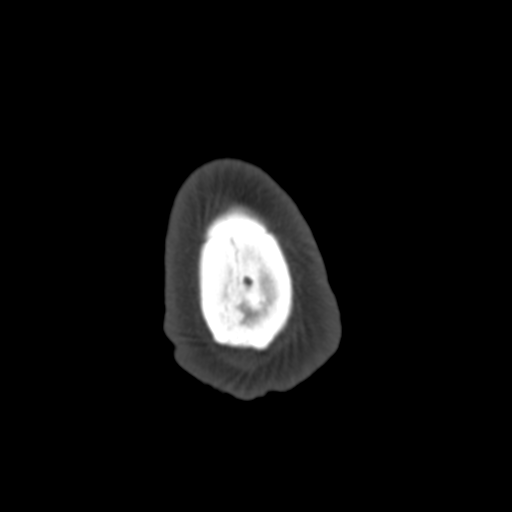

[Series 5: coronal · coronal · 0.39mm/px · 3 of 405 slices shown]
[im 135/405  bone]
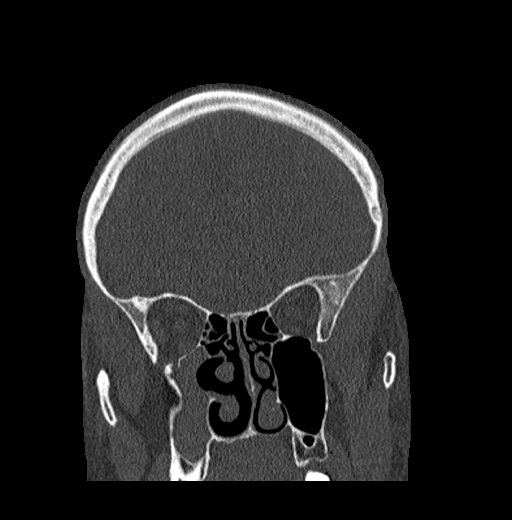
[im 180/405  bone]
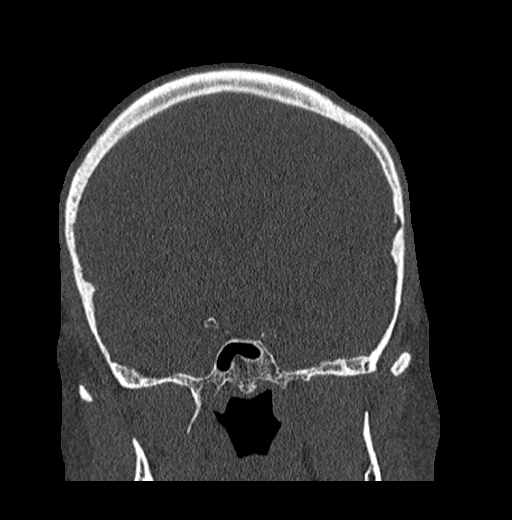
[im 225/405  bone]
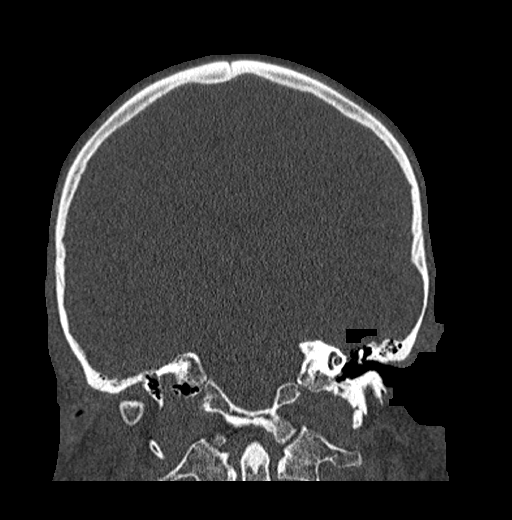

[Series 6: sag · sagittal · 0.40mm/px · 3 of 370 slices shown]
[im 124/370  bone]
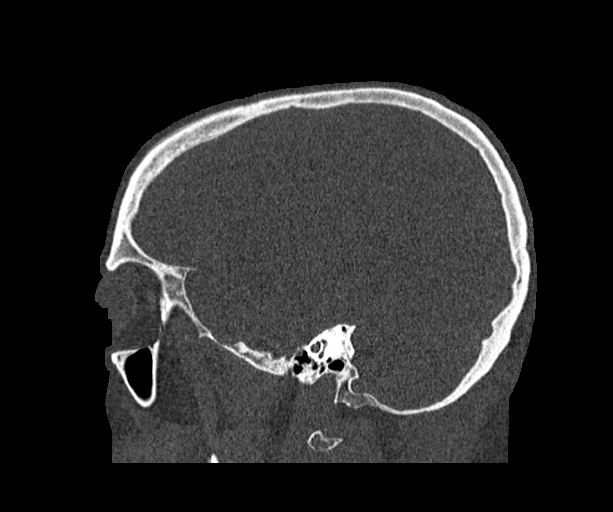
[im 185/370  bone]
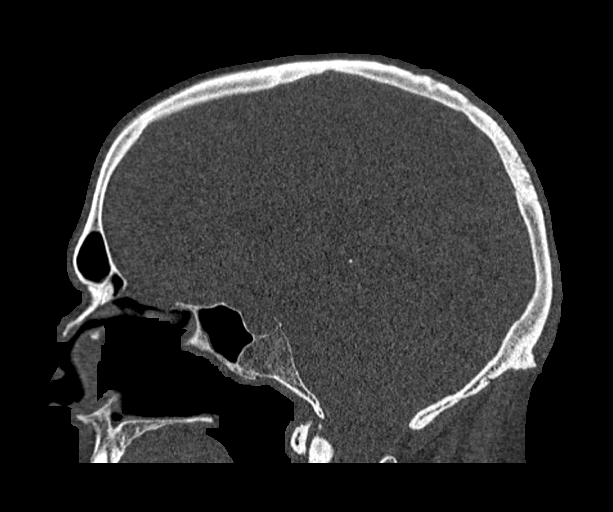
[im 247/370  bone]
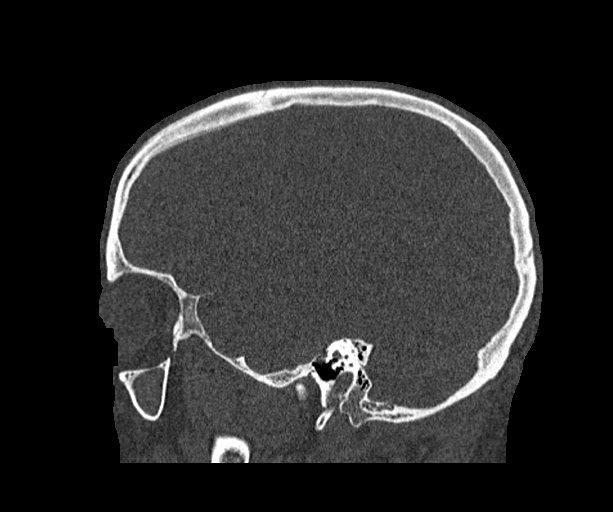

[14 of 47 positions shown; findings below may reference images not displayed]

Count of known CT and Cardiac Nuclear Medicine studies performed in the previous 
12 months = .
FINDINGS: -------------------------------------------------------------------------------- 
------------------------- 
SINUSES: 
FRONTAL SINUSES: Right frontal sinus demonstrates minimal membrane thickening 
along the inferior aspect, with similar findings on the left.     
ETHMOID AIR CELLS: Mild membrane thickening within the anterior most right 
ethmoid air cell. Keros type II.     
SPHENOID SINUSES: The right anterior clinoid process is pneumatized. Mild 
membrane thickening anterior aspect of the sphenoid sinus.     
MAXILLARY SINUSES: Complete opacification of the right maxillary sinus by 
hyperdense internal secretions. The opacification extends into the right 
ostiomeatal unit region, but does not extend into the right nasal cavity. See 
coronal image 114 of series 5. The floors of the maxillary sinuses are intact. 
Left maxillary sinus demonstrates minimal membrane thickening. 
PARANASAL SINUS DRAINAGE PATHWAYS: Patency of the frontoethmoidal recesses. 
Right ostiomeatal unit is occluded. Left ostiomeatal unit patent. 
Sphenoethmoidal recess is occluded by membrane thickening. 
-------------------------------------------------------------------------------- 
---------------------- 
OTHER: 

NASAL CAVITY / SKULL BASE: Intact nasal septum. It deviates to the left with a 5 
mm leftward directed nasal septal spur. Right concha bullosa. The cribriform 
plate is intact and symmetric. The left anterior clinoid process is not 
pneumatized. 
NON-SINUS STRUCTURES: No abnormality of the visualized orbits or intracranial 
compartments.   
-------------------------------------------------------------------------------- 
---------------------
IMPRESSION: Complete opacification of the right maxillary sinus by hyperdense secretions, 
which may reflect inspissated secretions or fungal etiologies. 
Other mild paranasal sinus membrane thickening as detailed above. 
Leftward directed nasal septal deviation. 
RADIATION DOSE REDUCTION: All CT scans are performed using radiation dose 
reduction techniques, when applicable.  Technical factors are evaluated and 
adjusted to ensure appropriate moderation of exposure.  Automated dose 
management technology is applied to adjust the radiation doses to minimize 
exposure while achieving diagnostic quality images.

## 2023-02-01 IMAGING — MG MAMMOGRAPHY DIAGNOSTIC BILATERAL 3[PERSON_NAME]
8 series · 9 of 24 positions shown · non-contrast
Comparison: Comparison was made to prior examinations.

________________________________________________________________________________________________ 
MAMMOGRAPHY DIAGNOSTIC BILATERAL 3THIELEN MORAWE, 02/01/2023 [DATE]: 
CLINICAL INDICATION: Palpable area left breast.
TECHNIQUE: Digital bilateral mammograms and 3-D Tomosynthesis were obtained. 
These were interpreted both primarily and with the aid of computer-aided 
detection system.  
BREAST DENSITY: (Level A) The breasts are almost entirely fatty.

[L CC]
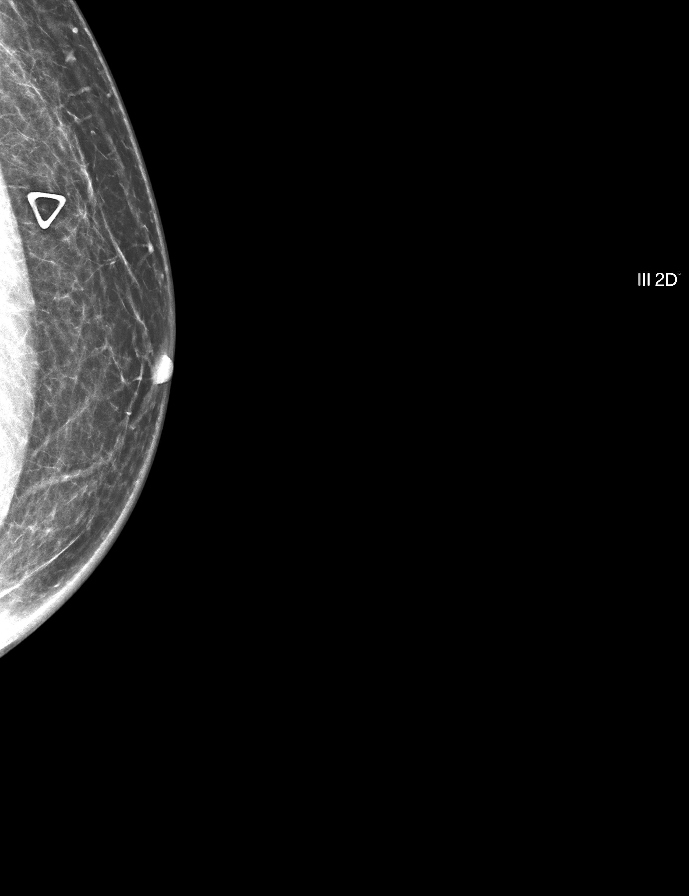

[R CC]
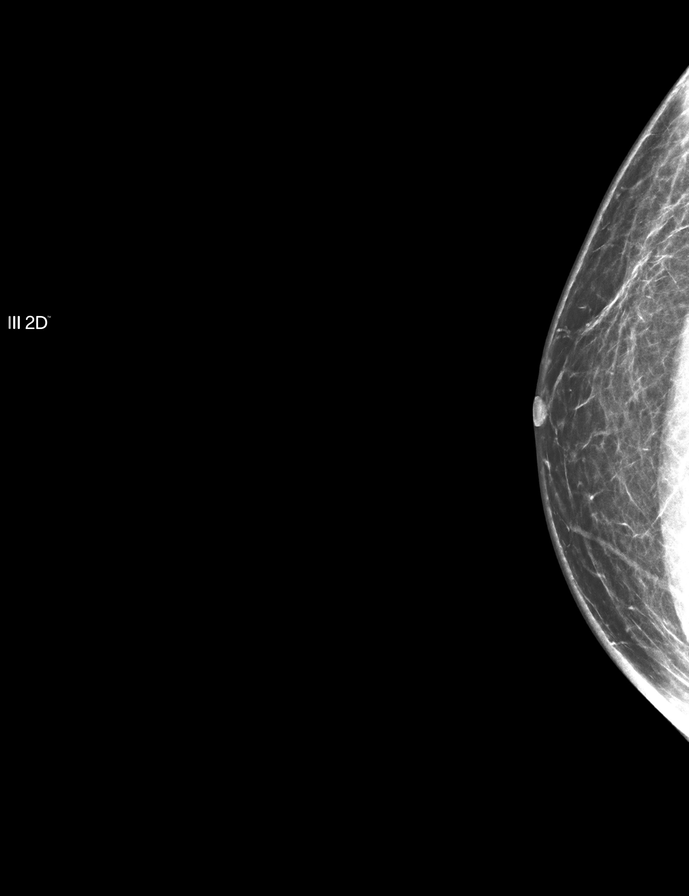

[L MLO]
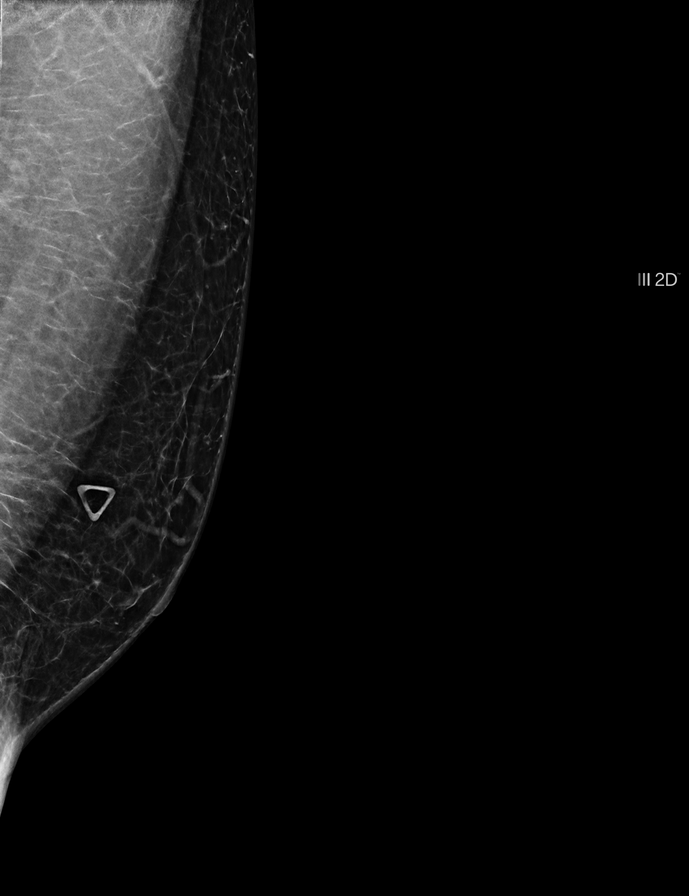

[R MLO]
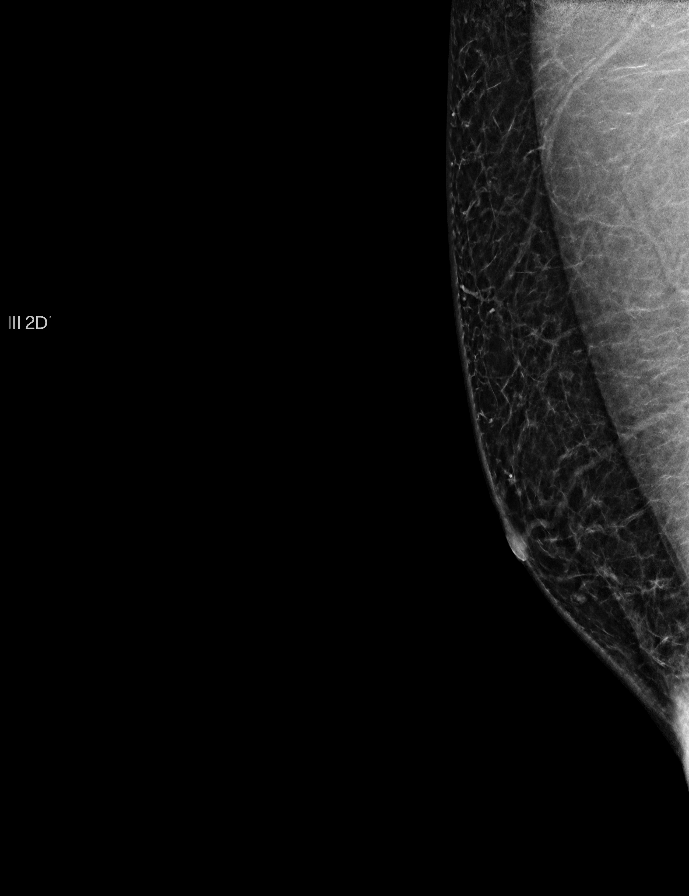

[R MLO tomo · 2 of 20 frames shown]
[frame 7/20]
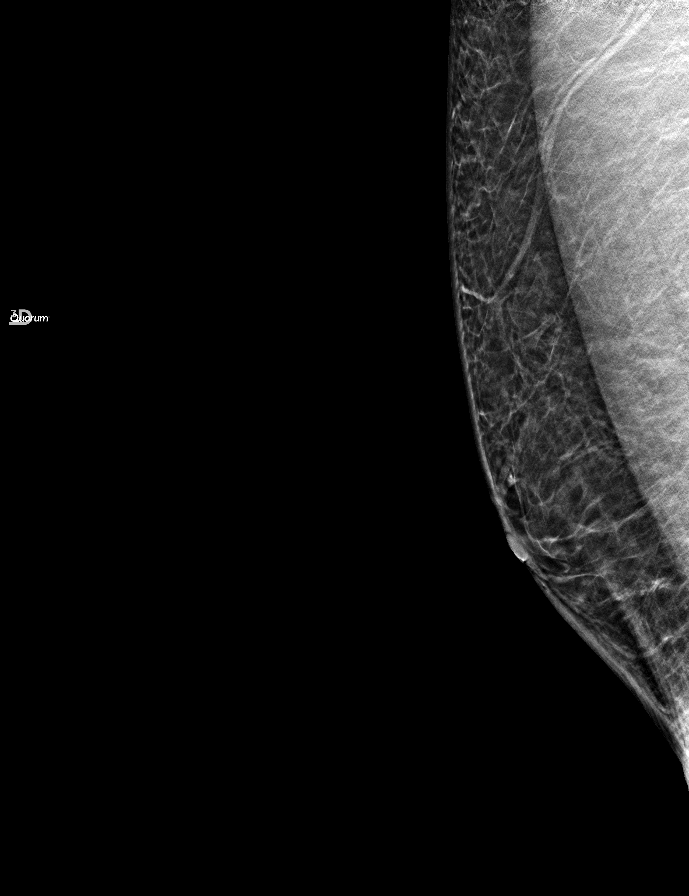
[frame 11/20]
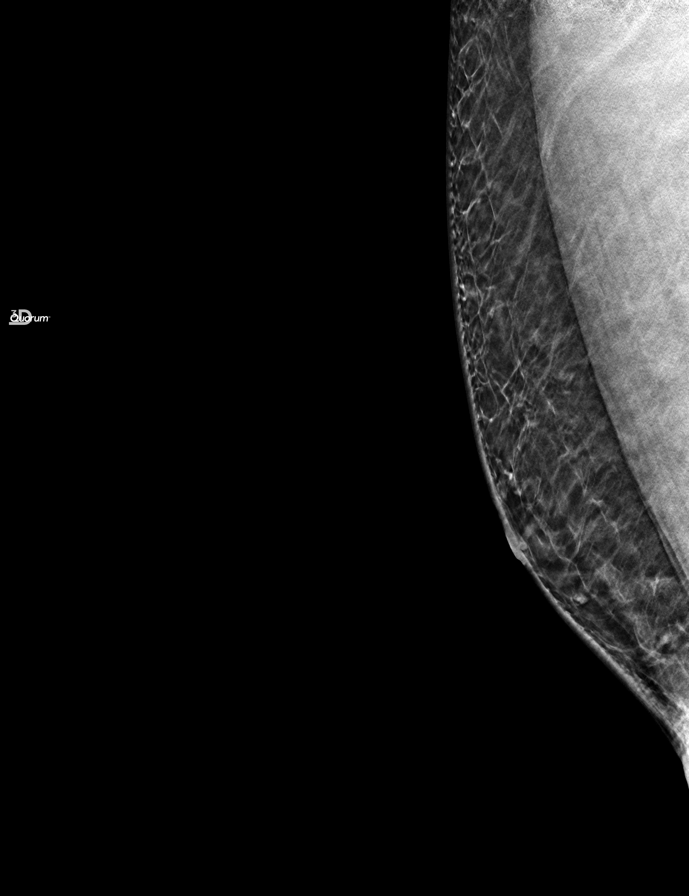

[L CC tomo · tomo slice 11/20.0]
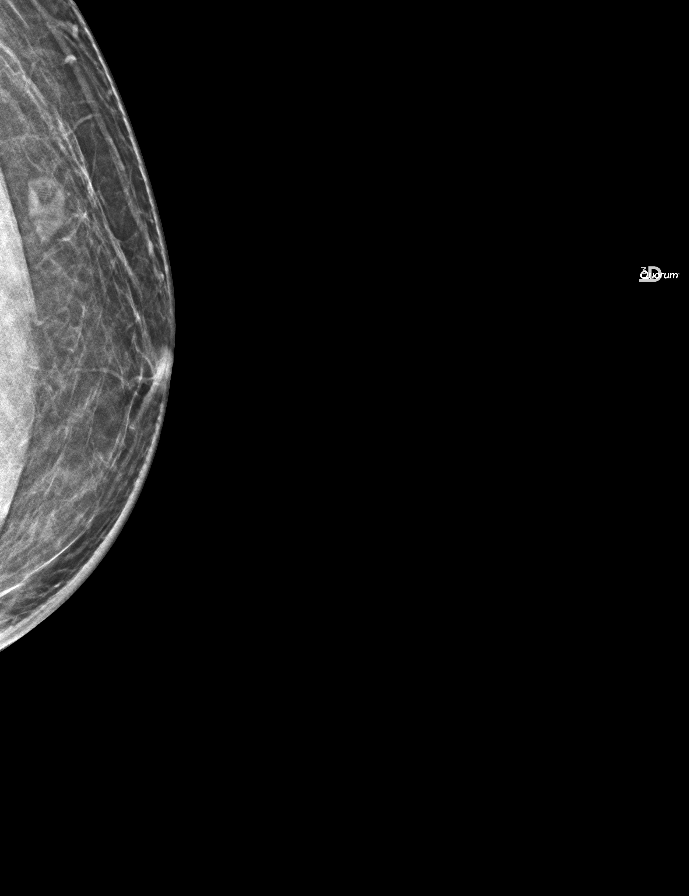

[L MLO tomo · tomo slice 11/22.0]
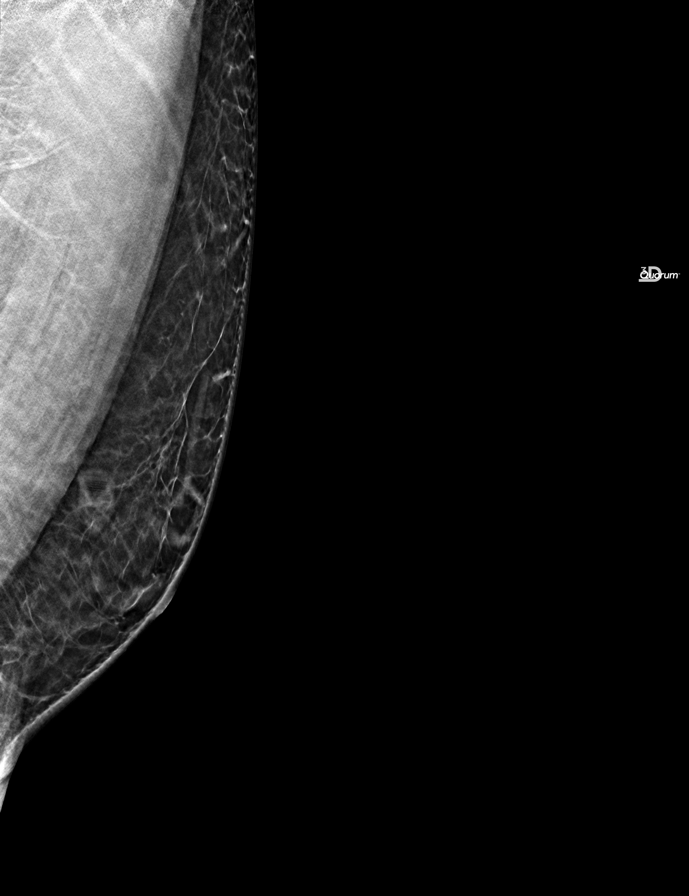

[R CC tomo · tomo slice 10/19.0]
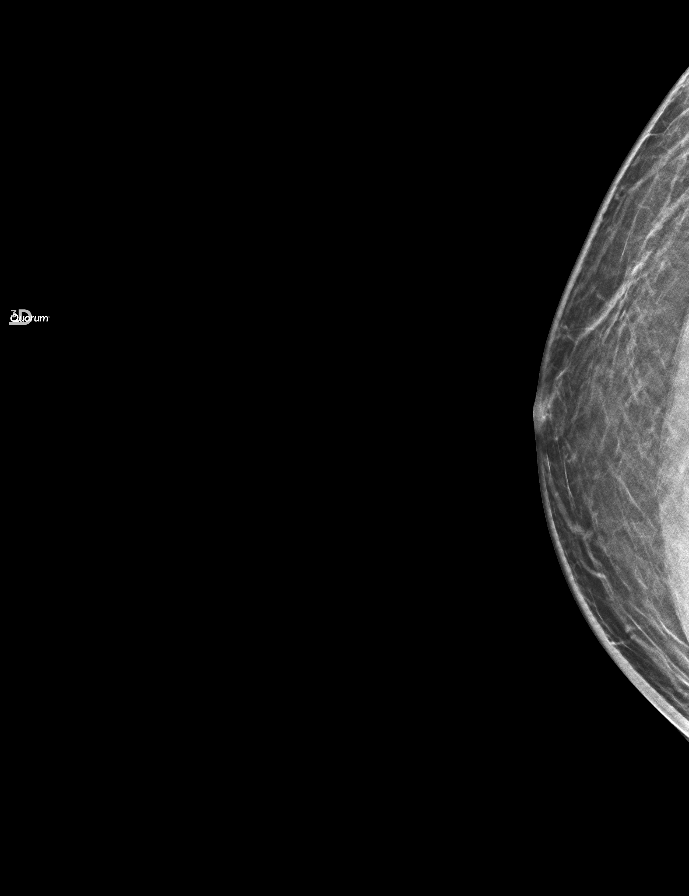

[9 of 24 positions shown; findings below may reference images not displayed]

FINDINGS: No dominant mass or architectural distortion. No suspicious 
calcification. Ultrasound evaluation of the area demonstrated a lipoma.
IMPRESSION: No mammographic abnormality. Ultrasound evaluation of the area demonstrated a 
small lipoma. 
(BI-RADS 2) Benign findings. Routine mammographic follow-up is recommended.

## 2023-11-07 ENCOUNTER — Ambulatory Visit (INDEPENDENT_AMBULATORY_CARE_PROVIDER_SITE_OTHER)

## 2023-11-07 ENCOUNTER — Ambulatory Visit (INDEPENDENT_AMBULATORY_CARE_PROVIDER_SITE_OTHER): Admitting: Physician Assistant

## 2023-11-07 ENCOUNTER — Encounter (INDEPENDENT_AMBULATORY_CARE_PROVIDER_SITE_OTHER): Payer: Self-pay

## 2023-11-07 VITALS — BP 136/78 | HR 75 | Temp 97.9°F | Resp 16 | Ht 71.65 in | Wt 190.0 lb

## 2023-11-07 DIAGNOSIS — S79922A Unspecified injury of left thigh, initial encounter: Secondary | ICD-10-CM

## 2023-11-07 DIAGNOSIS — M1612 Unilateral primary osteoarthritis, left hip: Secondary | ICD-10-CM

## 2023-11-07 DIAGNOSIS — S79912A Unspecified injury of left hip, initial encounter: Secondary | ICD-10-CM

## 2023-11-07 MED ORDER — NAPROXEN 500 MG PO TABS
500.0000 mg | ORAL_TABLET | Freq: Two times a day (BID) | ORAL | 0 refills | Status: AC | PRN
Start: 2023-11-07 — End: 2024-01-06

## 2023-11-07 MED ORDER — KETOROLAC TROMETHAMINE 30 MG/ML IJ SOLN
15.0000 mg | Freq: Once | INTRAMUSCULAR | Status: AC
Start: 2023-11-07 — End: 2023-11-07
  Administered 2023-11-07: 15 mg via INTRAMUSCULAR

## 2023-11-07 MED ORDER — LIDOCAINE 5 % EX PTCH
1.0000 | MEDICATED_PATCH | Freq: Two times a day (BID) | CUTANEOUS | 0 refills | Status: AC | PRN
Start: 2023-11-07 — End: ?
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
## 2023-11-10 ENCOUNTER — Other Ambulatory Visit (INDEPENDENT_AMBULATORY_CARE_PROVIDER_SITE_OTHER): Payer: Self-pay | Admitting: Orthopaedic Surgery

## 2023-11-10 ENCOUNTER — Ambulatory Visit (INDEPENDENT_AMBULATORY_CARE_PROVIDER_SITE_OTHER): Admitting: Orthopaedic Surgery

## 2023-11-10 ENCOUNTER — Telehealth (INDEPENDENT_AMBULATORY_CARE_PROVIDER_SITE_OTHER): Payer: Self-pay

## 2023-11-10 ENCOUNTER — Encounter (INDEPENDENT_AMBULATORY_CARE_PROVIDER_SITE_OTHER): Payer: Self-pay | Admitting: Orthopaedic Surgery

## 2023-11-10 DIAGNOSIS — S79912A Unspecified injury of left hip, initial encounter: Secondary | ICD-10-CM

## 2023-11-10 DIAGNOSIS — S79922A Unspecified injury of left thigh, initial encounter: Secondary | ICD-10-CM
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
## 2023-11-10 NOTE — Telephone Encounter (Signed)
 [facility] called that in order to process MRI, requested for insurance information faxed over and office note to [facility] at [phone].

## 2023-11-11 ENCOUNTER — Encounter (INDEPENDENT_AMBULATORY_CARE_PROVIDER_SITE_OTHER): Payer: Self-pay | Admitting: Orthopaedic Surgery

## 2023-11-12 ENCOUNTER — Ambulatory Visit
Admission: RE | Admit: 2023-11-12 | Discharge: 2023-11-12 | Discharge status: Home / Self Care | Source: Ambulatory Visit | Attending: Orthopaedic Surgery

## 2023-11-12 DIAGNOSIS — S79912A Unspecified injury of left hip, initial encounter: Secondary | ICD-10-CM | POA: Insufficient documentation

## 2023-11-15 ENCOUNTER — Ambulatory Visit (INDEPENDENT_AMBULATORY_CARE_PROVIDER_SITE_OTHER): Admitting: Orthopaedic Surgery

## 2023-11-17 ENCOUNTER — Encounter (INDEPENDENT_AMBULATORY_CARE_PROVIDER_SITE_OTHER): Payer: Self-pay | Admitting: Orthopaedic Surgery
